# Patient Record
Sex: Female | Born: 1990
Health system: Southern US, Community
[De-identification: ages and names within clinical notes are randomized; demographics above are authoritative.]

## PROBLEM LIST (undated history)

## (undated) DIAGNOSIS — E669 Obesity, unspecified: Secondary | ICD-10-CM

## (undated) DIAGNOSIS — F909 Attention-deficit hyperactivity disorder, unspecified type: Secondary | ICD-10-CM

## (undated) DIAGNOSIS — N938 Other specified abnormal uterine and vaginal bleeding: Secondary | ICD-10-CM

## (undated) DIAGNOSIS — F4310 Post-traumatic stress disorder, unspecified: Secondary | ICD-10-CM

## (undated) DIAGNOSIS — I10 Essential (primary) hypertension: Secondary | ICD-10-CM

## (undated) HISTORY — PX: TONSILLECTOMY AND ADENOIDECTOMY: SUR1326

## (undated) HISTORY — DX: Essential (primary) hypertension: I10

## (undated) HISTORY — DX: Obesity, unspecified: E66.9

## (undated) HISTORY — DX: Other specified abnormal uterine and vaginal bleeding: N93.8

---
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
## 2013-04-26 ENCOUNTER — Telehealth: Payer: Self-pay | Admitting: General Practice

## 2013-04-26 NOTE — Telephone Encounter (Signed)
PT WANTED APPT TO ESTABLISH CARE APPT MADE

## 2013-04-27 ENCOUNTER — Encounter: Payer: Self-pay | Admitting: Family Medicine

## 2013-04-27 ENCOUNTER — Encounter (INDEPENDENT_AMBULATORY_CARE_PROVIDER_SITE_OTHER): Payer: Self-pay

## 2013-04-27 ENCOUNTER — Ambulatory Visit (INDEPENDENT_AMBULATORY_CARE_PROVIDER_SITE_OTHER): Payer: BC Managed Care – PPO | Admitting: Family Medicine

## 2013-04-27 VITALS — BP 147/94 | HR 94 | Temp 97.9°F | Ht 66.5 in | Wt 350.8 lb

## 2013-04-27 DIAGNOSIS — R059 Cough, unspecified: Secondary | ICD-10-CM

## 2013-04-27 DIAGNOSIS — R05 Cough: Secondary | ICD-10-CM

## 2013-04-27 DIAGNOSIS — J029 Acute pharyngitis, unspecified: Secondary | ICD-10-CM

## 2013-04-27 DIAGNOSIS — J209 Acute bronchitis, unspecified: Secondary | ICD-10-CM

## 2013-04-27 LAB — POCT INFLUENZA A/B
Influenza A, POC: NEGATIVE
Influenza B, POC: NEGATIVE

## 2013-04-27 LAB — POCT RAPID STREP A (OFFICE): Rapid Strep A Screen: NEGATIVE

## 2013-04-27 MED ORDER — BENZONATATE 100 MG PO CAPS: 100.0000 mg | ORAL_CAPSULE | Freq: Three times a day (TID) | ORAL | Status: DC | PRN

## 2013-04-27 MED ORDER — AZITHROMYCIN 250 MG PO TABS: ORAL_TABLET | ORAL | Status: DC

## 2013-04-27 NOTE — Patient Instructions (Signed)

## 2013-04-27 NOTE — Progress Notes (Signed)
   Subjective:    Patient ID: Tara Bush, female    DOB: 1990-07-12, 23 y.o.   MRN: [id number]  HPI This 23 y.o. female presents for evaluation of cough, congestion, wheezing, and  malaise.   Review of Systems No chest pain, SOB, HA, dizziness, vision change, N/V, diarrhea, constipation, dysuria, urinary urgency or frequency, myalgias, arthralgias or rash.     Objective:   Physical Exam  Vital signs noted  Well developed well nourished obese female.  HEENT - Head atraumatic Normocephalic                Eyes - PERRLA, Conjuctiva - clear Sclera- Clear EOMI                Ears - EAC's Wnl TM's Wnl Gross Hearing WNL                Throat - oropharanx wnl Respiratory - Lungs diminished due to body habitus Cardiac - RRR S1 and S2 without murmur GI - Abdomen soft Nontender and bowel sounds active x 4 Extremities - No edema. Neuro - Grossly intact.      Assessment & Plan:  Cough - Plan: POCT Influenza A/B, POCT rapid strep A, azithromycin (ZITHROMAX) 250 MG tablet, benzonatate (TESSALON PERLES) 100 MG capsule  Sore throat - Plan: POCT Influenza A/B, POCT rapid strep A, azithromycin (ZITHROMAX) 250 MG tablet  Acute bronchitis - Plan: azithromycin (ZITHROMAX) 250 MG tablet, benzonatate (TESSALON PERLES) 100 MG capsule Push po fluids, rest, tylenol and motrin otc prn as directed for fever, arthralgias, and myalgias.  Follow up prn if sx's continue or persist.  Deatra CanterWilliam J Jon Lall FNP

## 2013-05-04 ENCOUNTER — Encounter: Payer: Self-pay | Admitting: *Deleted

## 2013-05-19 ENCOUNTER — Encounter: Payer: Self-pay | Admitting: General Practice

## 2013-05-19 ENCOUNTER — Ambulatory Visit (INDEPENDENT_AMBULATORY_CARE_PROVIDER_SITE_OTHER): Payer: BC Managed Care – PPO | Admitting: General Practice

## 2013-05-19 VITALS — BP 144/77 | HR 84 | Temp 98.6°F | Ht 66.5 in | Wt 353.4 lb

## 2013-05-19 DIAGNOSIS — R635 Abnormal weight gain: Secondary | ICD-10-CM

## 2013-05-19 DIAGNOSIS — Z7689 Persons encountering health services in other specified circumstances: Secondary | ICD-10-CM

## 2013-05-19 NOTE — Progress Notes (Signed)
   Subjective:    Patient ID: Tara Bush, female    DOB: Jun 25, 1990, 23 y.o.   MRN: [id number]  HPI Patient presents today to establish care. She denies having a primary provider. Had labs drawn in past few weeks when having gyn exam. She denies any chronic health issues. Taking deporvera injections for pregnancy prevention. Currently participating in weight watchers and exercising regularly.     Review of Systems  Constitutional: Negative for fever and chills.  Respiratory: Negative for chest tightness and shortness of breath.   Cardiovascular: Negative for chest pain and palpitations.  Gastrointestinal: Negative for nausea, vomiting, abdominal pain, diarrhea, constipation and blood in stool.  Neurological: Negative for dizziness, weakness and headaches.       Objective:   Physical Exam  Constitutional: She is oriented to person, place, and time.  Obese   HENT:  Head: Normocephalic and atraumatic.  Right Ear: External ear normal.  Left Ear: External ear normal.  Mouth/Throat: Oropharynx is clear and moist.  Eyes: EOM are normal. Pupils are equal, round, and reactive to light.  Neck: Normal range of motion. Neck supple.  Pulmonary/Chest: Effort normal and breath sounds normal.  Abdominal: Soft. Bowel sounds are normal. She exhibits no distension. There is no tenderness.  Neurological: She is alert and oriented to person, place, and time.  Skin: Skin is warm and dry.  Psychiatric: She has a normal mood and affect.          Assessment & Plan:  1. Encounter to establish care   2. Morbid obesity -discussed importance of healthy eating and regular exercise -Patient to have recent labs faxed from GYN office -RTO prn -Patient verbalized understanding Coralie KeensMae E. Rachael Zapanta, FNP-C

## 2013-05-21 ENCOUNTER — Ambulatory Visit: Payer: BC Managed Care – PPO | Admitting: General Practice

## 2013-05-21 ENCOUNTER — Ambulatory Visit: Payer: Self-pay | Admitting: General Practice

## 2013-05-26 ENCOUNTER — Encounter: Payer: Self-pay | Admitting: *Deleted

## 2013-08-30 ENCOUNTER — Ambulatory Visit: Payer: BC Managed Care – PPO | Admitting: Family Medicine

## 2014-03-09 ENCOUNTER — Encounter: Payer: Self-pay | Admitting: Family Medicine

## 2014-03-09 ENCOUNTER — Ambulatory Visit (INDEPENDENT_AMBULATORY_CARE_PROVIDER_SITE_OTHER): Payer: BLUE CROSS/BLUE SHIELD | Admitting: Family Medicine

## 2014-03-09 VITALS — BP 152/102 | HR 131 | Temp 98.5°F | Ht 66.5 in | Wt 365.0 lb

## 2014-03-09 DIAGNOSIS — I10 Essential (primary) hypertension: Secondary | ICD-10-CM

## 2014-03-09 DIAGNOSIS — N938 Other specified abnormal uterine and vaginal bleeding: Secondary | ICD-10-CM

## 2014-03-09 MED ORDER — LISINOPRIL 5 MG PO TABS: 5.0000 mg | ORAL_TABLET | Freq: Every day | ORAL | Status: DC

## 2014-03-09 NOTE — Progress Notes (Signed)
Subjective:    Patient ID: Tara Bush, female    DOB: 1990-10-07, 24 y.o.   MRN: [id number]  HPI 24 year old female comes in today to discuss obesity and concern of elevated blood pressure. She has not monitored her blood pressure but feels that it is elevated. She also complains of dysfunctional uterine bleeding for about 4 months. She has seen a gynecologist in the past for this but it has not resolved and she hasn't been able to work a follow up into her schedule.  We spent a lot of time talking about her weight and how it might impact her life going forward. She has been on several diets and had some success only to fall back into her normal eating patterns and regain of lost weight. She does not really have money to spend on somewhat more popular diet plans like Weight Watchers but given her past experience I think she should be able to realize some success in this endeavor  She has had vaginal bleeding for several months now. She has been worked up by gynecologist in [place] but I do not have results. They seem to relate everything back to her weight. She questions about possibility of using oral contraceptives to help regulate bleeding  Patient Active Problem List   Diagnosis Date Noted  . Morbid obesity 05/20/2013   Outpatient Encounter Prescriptions as of 03/09/2014  Medication Sig  . medroxyPROGESTERone (PROVERA) 10 MG tablet   . OVER THE COUNTER MEDICATION Take 1 capsule by mouth 2 (two) times daily as needed. For cough. Cough and Cold gel caps. Unknown active ingredients or strengths   . PRESCRIPTION MEDICATION Take 1 tablet by mouth daily. Oral birth control unknown name and strength.   . Vitamin D, Ergocalciferol, (DRISDOL) 50000 UNITS CAPS capsule        Review of Systems  Constitutional: Positive for unexpected weight change.  HENT: Negative.   Eyes: Negative.   Respiratory: Negative.   Cardiovascular: Negative.   Gastrointestinal: Negative.   Endocrine:  Negative.   Genitourinary: Positive for vaginal bleeding.  Hematological: Negative.   Psychiatric/Behavioral: Negative.        Objective:   Physical Exam  Constitutional: She is oriented to person, place, and time. She appears well-developed and well-nourished.  Morbid obesity as noted before  Eyes: Conjunctivae and EOM are normal.  Neck: Normal range of motion. Neck supple.  Cardiovascular: Normal rate and regular rhythm.   Pulmonary/Chest: Effort normal and breath sounds normal.  Abdominal: Soft. Bowel sounds are normal.  Musculoskeletal: Normal range of motion.  Neurological: She is alert and oriented to person, place, and time. She has normal reflexes.  Skin: Skin is warm and dry.  Psychiatric: She has a normal mood and affect. Her behavior is normal. Thought content normal.   BP 152/102 mmHg  Pulse 131  Temp(Src) 98.5 F (36.9 C) (Oral)  Ht 5' 6.5" (1.689 m)  Wt 365 lb (165.563 kg)  BMI 58.04 kg/m2        Assessment & Plan:  1. Dysfunctional uterine bleeding Consider use of oral contraceptives once blood pressure is better controlled. Would also like to obtain records from gynecologist to see what workup has been thus far  2. Obesity, morbid Commit to weight loss cut back on carbohydrates and exercise. She has insight into her problem and I think she could be successful if she really sets her mind to it  3. Essential hypertension Begin lisinopril 5 mg and recheck her in  one month. She may be able to come off medicines if she 6   Frederica KusterStephen M Miller MDseeds with weight loss

## 2014-04-11 ENCOUNTER — Ambulatory Visit: Payer: BLUE CROSS/BLUE SHIELD | Admitting: Family Medicine

## 2014-05-25 ENCOUNTER — Ambulatory Visit: Payer: BLUE CROSS/BLUE SHIELD | Admitting: Family Medicine

## 2014-05-31 ENCOUNTER — Other Ambulatory Visit: Payer: BLUE CROSS/BLUE SHIELD | Admitting: Women's Health

## 2014-06-07 ENCOUNTER — Encounter: Payer: Self-pay | Admitting: Advanced Practice Midwife

## 2014-06-07 ENCOUNTER — Other Ambulatory Visit (HOSPITAL_COMMUNITY)
Admission: RE | Admit: 2014-06-07 | Discharge: 2014-06-07 | Disposition: A | Discharge status: Home / Self Care | Payer: BLUE CROSS/BLUE SHIELD | Source: Ambulatory Visit | Attending: Obstetrics and Gynecology | Admitting: Obstetrics and Gynecology

## 2014-06-07 ENCOUNTER — Ambulatory Visit (INDEPENDENT_AMBULATORY_CARE_PROVIDER_SITE_OTHER): Payer: BLUE CROSS/BLUE SHIELD | Admitting: Advanced Practice Midwife

## 2014-06-07 VITALS — BP 150/86 | Ht 67.0 in | Wt 369.5 lb

## 2014-06-07 DIAGNOSIS — Z3200 Encounter for pregnancy test, result unknown: Secondary | ICD-10-CM

## 2014-06-07 DIAGNOSIS — I10 Essential (primary) hypertension: Secondary | ICD-10-CM | POA: Insufficient documentation

## 2014-06-07 DIAGNOSIS — N938 Other specified abnormal uterine and vaginal bleeding: Secondary | ICD-10-CM | POA: Insufficient documentation

## 2014-06-07 DIAGNOSIS — Z3202 Encounter for pregnancy test, result negative: Secondary | ICD-10-CM | POA: Diagnosis not present

## 2014-06-07 DIAGNOSIS — Z01419 Encounter for gynecological examination (general) (routine) without abnormal findings: Secondary | ICD-10-CM | POA: Insufficient documentation

## 2014-06-07 LAB — POCT URINE PREGNANCY: PREG TEST UR: NEGATIVE

## 2014-06-07 MED ORDER — NORGESTIMATE-ETH ESTRADIOL 0.25-35 MG-MCG PO TABS: 1.0000 | ORAL_TABLET | Freq: Every day | ORAL | Status: DC

## 2014-06-07 NOTE — Progress Notes (Signed)
Tara Bush 24 y.o.  Filed Vitals:   06/07/14 1507  BP: 150/86     Past Medical History: Past Medical History  Diagnosis Date  . Post traumatic stress disorder (PTSD)   . Obesity   . Dysfunctional uterine bleeding   . Hypertension     started lisinopril 1/16    Past Surgical History: Past Surgical History  Procedure Laterality Date  . Tonsillectomy and adenoidectomy      Family History: Family History  Problem Relation Age of Onset  . Diabetes Mother   . Multiple sclerosis Mother   . Stroke Mother   . Hypertension Mother   . Hypertension Father   . Cancer Maternal Grandmother     ovarian/ uterine     Social History: History  Substance Use Topics  . Smoking status: Never Smoker   . Smokeless tobacco: Never Used  . Alcohol Use: No    Allergies:  Allergies  Allergen Reactions  . Nutritional Supplements Anaphylaxis      Current outpatient prescriptions:  .  norgestimate-ethinyl estradiol (ORTHO-CYCLEN,SPRINTEC,PREVIFEM) 0.25-35 MG-MCG tablet, Take 1 tablet by mouth daily., Disp: 1 Package, Rfl: 11  History of Present Illness: Here for her first pap.  She is married and wants birth contril (using condoms).  She bled for several months last year, had US and bloodwork that didn't reveal any abnormaliities.  Morbidly obese, could contribute.  Often skips periods as well.  She has been bleeding off and on, light to heavy, for 6 months.  She wants COC's.  States she has started [employer] wat[employer]. Was dx wit hHTN and put on lisionoprl in January.  Took it for one month only (thought that was all she would have to take it).    Review of Systems   Patient denies any headaches, blurred vision, shortness of breath, chest pain, abdominal pain, problems with bowel movements, urination, or intercourse.   Physical Exam: General:  Well developed, well nourished, no acute distress Skin:  Warm and dry Neck:  Midline trachea, normal thyroid Lungs; Clear to  auscultation bilaterally Breast:  No dominant palpable mass, retraction, or nipple discharge Cardiovascular: Regular rate and rhythm Abdomen:  Soft, non tender, no hepatosplenomegaly Pelvic:  External genitalia is normal in appearance.  The vagina is normal in appearance. Scant amount of dark red blood.  The cervix is bulbous and non friable.   Uterus is impossible to feel.  No adnexal masses or tenderness noted.  Extremities:  No swelling or varicosities noted Psych:  No mood changes.     Impression: Pap and physical DUB ? R/t morbid obesity HTN, untreated     Plan: Find out what kind of gardisl she has taken, get last shot next visit.   See PCP and get back on lisinopril  Continue weight watchers  Start Sprintec F/U 3 months

## 2014-06-09 LAB — CYTOLOGY - PAP

## 2014-09-06 ENCOUNTER — Ambulatory Visit: Payer: BLUE CROSS/BLUE SHIELD | Admitting: Obstetrics & Gynecology

## 2014-09-06 ENCOUNTER — Encounter: Payer: Self-pay | Admitting: Obstetrics & Gynecology

## 2014-11-28 ENCOUNTER — Telehealth: Payer: Self-pay | Admitting: Family Medicine

## 2014-12-14 ENCOUNTER — Encounter: Payer: Self-pay | Admitting: Pediatrics

## 2014-12-14 ENCOUNTER — Ambulatory Visit (INDEPENDENT_AMBULATORY_CARE_PROVIDER_SITE_OTHER): Payer: BLUE CROSS/BLUE SHIELD | Admitting: Pediatrics

## 2014-12-14 VITALS — BP 149/87 | HR 89 | Temp 98.6°F | Ht 67.0 in | Wt 389.8 lb

## 2014-12-14 DIAGNOSIS — R55 Syncope and collapse: Secondary | ICD-10-CM

## 2014-12-14 DIAGNOSIS — I10 Essential (primary) hypertension: Secondary | ICD-10-CM | POA: Diagnosis not present

## 2014-12-14 MED ORDER — HYDROCHLOROTHIAZIDE 12.5 MG PO TABS: 12.5000 mg | ORAL_TABLET | Freq: Every day | ORAL | Status: DC

## 2014-12-14 NOTE — Progress Notes (Signed)
Subjective:    Patient ID: Tara Bush, female    DOB: 04-30-1990, 24 y.o.   MRN: [id number]  CC: weight loss strategies  HPI: Tara Bush is a 24 y.o. female presenting on 12/14/2014 for Discuss Weight Loss  Wants to talk about weight loss. BP has been elevated in the past.  Working full time, in school full time Not eating much salt Doing well with breakfast and lunch sometimes, [place], salad, grapes. 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 Felt better as soon as she sat down. No CP or SOB. Did not pass out. Had a similar episode a year ago on a very hot day when she was overdressed. No fevers.  Relevant past medical, surgical, family and social history reviewed and updated as indicated. Interim medical history since our last visit reviewed. Allergies and medications reviewed and updated.   ROS: Per HPI unless specifically indicated above  Past Medical History Patient Active Problem List   Diagnosis Date Noted  . DUB (dysfunctional uterine bleeding) 06/07/2014  . Hypertension   . Morbid obesity (HCC) 05/20/2013    Current Outpatient Prescriptions  Medication Sig Dispense Refill  . hydrochlorothiazide (HYDRODIURIL) 12.5 MG tablet Take 1 tablet (12.5 mg total) by mouth daily. 30 tablet 1   No current facility-administered medications for this visit.       Objective:    BP 149/87 mmHg  Pulse 89   Temp(Src) 98.6 F (37 C) (Oral)  Ht 5\' 7"  (1.702 m)  Wt 389 lb 12.8 oz (176.812 kg)  BMI 61.04 kg/m2  Wt Readings from Last 3 Encounters:  12/14/14 389 lb 12.8 oz (176.812 kg)  06/07/14 369 lb 8 oz (167.604 kg)  03/09/14 365 lb (165.563 kg)    en: NAD, alert, cooperative with exam, NCAT EYES: EOMI, no scleral injection or icterus ENT:  OP without erythema LYMPH: no cervical LAD CV: NRRR, normal S1/S2, no murmur, WWP Resp: CTABL, no wheezes, normal WOB Abd: +BS, soft, NTND. Ext: No edema, warm Neuro: Alert and oriented, strength equal b/l UE and LE     Assessment & Plan:   Lashondra was seen today for discuss weight loss strategies. Pt feels addicted to food. Discussed multiple goals, set with pt, that she can work on in next 4 weeks, see pt instructions for specifics. Also recommended she see a counselor for continued dependence on food, pt thinks this will be helpful. Blood pressure has been high for most recent visits, started on lisinopril in the past says it made her feel bad. Will check labs today, start HCTZ 12.5mg . Likely with episode of vasovagal syncope last weekend after getting overheated. Did not pass out. Felt symptoms coming on, improved as soon as she sat down. Will let me know if has another episode.   Diagnoses and all orders for this visit:  Essential hypertension -     hydrochlorothiazide (HYDRODIURIL) 12.5 MG  tablet; Take 1 tablet (12.5 mg total) by mouth daily. -     BMP8+EGFR -     Lipid panel  Morbid obesity, unspecified obesity type (HCC) -     Lipid panel  Vasovagal syncope  I spent 30 minutes with the patient with over 50% of the encounter time dedicated to counseling on the above problems.    Follow up plan: Return in about 4 weeks (around 01/11/2015).  Assunta Found, MD [facility] 12/14/2014, 10:39 AM

## 2014-12-14 NOTE — Patient Instructions (Addendum)
Packing lunch night before No fast food Go for 15-30 minute walk at lunch Zumba in the morning before work 30-60 minutes walk on weekend days Instead of extra meal, go for a walk, go do something fun Planning meals and making an event out of them  24 hour a day CRISIS NUMBER: [phone]   List of local counseling services:  [facility]- Therapist [address] [place] ,[place] [id number] [phone] Children limited to anxiety and depression- NO ADD/ADHD Does not accept Medicaid  [facility] [address] [place], [place] [phone] Does see children Does accept medicaid Will assess for Autism but not treat  [facility] [address] [place] [phone] Does see children  Does accept Medicaid Medication management- substance abuse- bipolar- grief- family-marriage- OCD- Anxiety- PTSD  [facility] [address] [place]  [phone] Does see children Does accept medicaid They do perform psychological testing  [facility] [address] [place] Schedule through [employer] [phone] Patient must call and make own appointment Does se children Does accept Medicaid  [facility] [address] [place], [place] [phone] Sees Children 7-10 accompanied by an adult, 11 and up by themselves Does accept Medicaid Will see patients with- substance abuse-ADHD-ADD-Bipolar-Domestic violence-Marriage counseling- Family Counseling and sexual abuse  [facility]- Psychologist and Psychiatrist [address] [place] [phone] Does see children Does accept [facility]  [facility] [address] [place] [phone]  Dr. Dub MikesLugo-  Psychiatrist [address] [place], [place] [phone] Specializes in ADHD and addictions  [facility] [address] [place] [phone] Does Child psychological testing  [facility] [address] [place] [phone] Does Accept Medicaid Evaluates for Autism  [facility] [address] [place] [phone] Does Not accept Medicaid  Dr. Estelle GrumblesAkinlayo [address] [place] [phone] Does not Take Medicaid  [facility] [address] [place], [place] [address] [phone] Takes Medicaid WIll see children as young as 3

## 2014-12-15 LAB — LIPID PANEL
CHOL/HDL RATIO: 5.2 ratio — AB (ref 0.0–4.4)
Cholesterol, Total: 162 mg/dL (ref 100–199)
HDL: 31 mg/dL — AB (ref >39)
LDL Calculated: 106 mg/dL — ABNORMAL HIGH (ref 0–99)
TRIGLYCERIDES: 124 mg/dL (ref 0–149)
VLDL CHOLESTEROL CAL: 25 mg/dL (ref 5–40)

## 2014-12-15 LAB — BMP8+EGFR
BUN/Creatinine Ratio: 18 (ref 8–20)
BUN: 12 mg/dL (ref 6–20)
CALCIUM: 9.3 mg/dL (ref 8.7–10.2)
CO2: 24 mmol/L (ref 18–29)
CREATININE: 0.68 mg/dL (ref 0.57–1.00)
Chloride: 100 mmol/L (ref 97–106)
GFR, EST AFRICAN AMERICAN: 144 mL/min/{1.73_m2} (ref >59)
GFR, EST NON AFRICAN AMERICAN: 125 mL/min/{1.73_m2} (ref >59)
Glucose: 92 mg/dL (ref 65–99)
Potassium: 4.8 mmol/L (ref 3.5–5.2)
Sodium: 138 mmol/L (ref 136–144)

## 2014-12-16 ENCOUNTER — Telehealth: Payer: Self-pay | Admitting: Family Medicine

## 2014-12-16 NOTE — Telephone Encounter (Signed)
Patient aware of results.

## 2014-12-16 NOTE — Progress Notes (Signed)
Patient aware.

## 2014-12-27 ENCOUNTER — Telehealth: Payer: Self-pay | Admitting: Pediatrics

## 2014-12-27 DIAGNOSIS — E282 Polycystic ovarian syndrome: Secondary | ICD-10-CM

## 2015-01-03 MED ORDER — NORGESTIMATE-ETH ESTRADIOL 0.25-35 MG-MCG PO TABS: 1.0000 | ORAL_TABLET | Freq: Every day | ORAL | Status: DC

## 2015-01-03 NOTE — Telephone Encounter (Signed)
Called pt, periods are usually irregular, has them for a couple of months, then doesn't have them for a couple of months. Usually would have one day with lots of bleeding, then not as much bleeding the rest of the time. Now on 2.5 weeks of bleeding. Has had up to 7 months of needing to change pad multiple times a day. Is not sure how long a normal period would last for her because all so varied. Migraine once a year, light sensitivity, no auras. No history of clotting problems in family. Does have hair growth on face, with irregular periods likely has PCOS. Next visit needs TSH check in addition to BMP for new start HCTZ. Will start OCP.

## 2015-01-03 NOTE — Telephone Encounter (Signed)
Patient states that since starting the HCTZ she is experiencing really bad menstrual cycles. Please advise

## 2015-01-11 ENCOUNTER — Ambulatory Visit: Payer: BLUE CROSS/BLUE SHIELD | Admitting: Pediatrics

## 2015-01-18 ENCOUNTER — Ambulatory Visit: Payer: BLUE CROSS/BLUE SHIELD | Admitting: Pediatrics

## 2015-01-19 ENCOUNTER — Encounter: Payer: Self-pay | Admitting: Family Medicine

## 2015-01-29 ENCOUNTER — Emergency Department (HOSPITAL_COMMUNITY): Payer: No Typology Code available for payment source

## 2015-01-29 ENCOUNTER — Emergency Department (HOSPITAL_COMMUNITY)
Admission: EM | Admit: 2015-01-29 | Discharge: 2015-01-29 | Disposition: A | Discharge status: Home / Self Care | Payer: No Typology Code available for payment source | Attending: Emergency Medicine | Admitting: Emergency Medicine

## 2015-01-29 ENCOUNTER — Encounter (HOSPITAL_COMMUNITY): Payer: Self-pay | Admitting: Emergency Medicine

## 2015-01-29 DIAGNOSIS — Z8659 Personal history of other mental and behavioral disorders: Secondary | ICD-10-CM | POA: Insufficient documentation

## 2015-01-29 DIAGNOSIS — S3992XA Unspecified injury of lower back, initial encounter: Secondary | ICD-10-CM | POA: Diagnosis not present

## 2015-01-29 DIAGNOSIS — E669 Obesity, unspecified: Secondary | ICD-10-CM | POA: Insufficient documentation

## 2015-01-29 DIAGNOSIS — Y998 Other external cause status: Secondary | ICD-10-CM | POA: Diagnosis not present

## 2015-01-29 DIAGNOSIS — Y92410 Unspecified street and highway as the place of occurrence of the external cause: Secondary | ICD-10-CM | POA: Insufficient documentation

## 2015-01-29 DIAGNOSIS — Y9389 Activity, other specified: Secondary | ICD-10-CM | POA: Diagnosis not present

## 2015-01-29 DIAGNOSIS — Z8742 Personal history of other diseases of the female genital tract: Secondary | ICD-10-CM | POA: Insufficient documentation

## 2015-01-29 DIAGNOSIS — Z793 Long term (current) use of hormonal contraceptives: Secondary | ICD-10-CM | POA: Insufficient documentation

## 2015-01-29 DIAGNOSIS — S299XXA Unspecified injury of thorax, initial encounter: Secondary | ICD-10-CM | POA: Diagnosis present

## 2015-01-29 DIAGNOSIS — S298XXA Other specified injuries of thorax, initial encounter: Secondary | ICD-10-CM

## 2015-01-29 DIAGNOSIS — I10 Essential (primary) hypertension: Secondary | ICD-10-CM | POA: Insufficient documentation

## 2015-01-29 MED ORDER — IBUPROFEN 800 MG PO TABS
800.0000 mg | ORAL_TABLET | Freq: Once | ORAL | Status: AC
  Administered 2015-01-29: 800 mg via ORAL
  Filled 2015-01-29: qty 1
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
## 2015-03-30 ENCOUNTER — Encounter: Payer: Self-pay | Admitting: Pediatrics

## 2015-03-30 ENCOUNTER — Ambulatory Visit (INDEPENDENT_AMBULATORY_CARE_PROVIDER_SITE_OTHER): Payer: BLUE CROSS/BLUE SHIELD | Admitting: Pediatrics

## 2015-03-30 VITALS — BP 179/97 | HR 83 | Temp 97.4°F | Ht 67.0 in | Wt 384.2 lb

## 2015-03-30 DIAGNOSIS — E282 Polycystic ovarian syndrome: Secondary | ICD-10-CM | POA: Diagnosis not present

## 2015-03-30 DIAGNOSIS — Z6841 Body Mass Index (BMI) 40.0 and over, adult: Secondary | ICD-10-CM | POA: Insufficient documentation

## 2015-03-30 DIAGNOSIS — I10 Essential (primary) hypertension: Secondary | ICD-10-CM | POA: Diagnosis not present

## 2015-03-30 LAB — POCT URINE PREGNANCY: PREG TEST UR: NEGATIVE

## 2015-03-30 MED ORDER — HYDROCHLOROTHIAZIDE 25 MG PO TABS: 25.0000 mg | ORAL_TABLET | Freq: Every day | ORAL | Status: DC

## 2015-03-30 MED ORDER — NORGESTIMATE-ETH ESTRADIOL 0.25-35 MG-MCG PO TABS: 1.0000 | ORAL_TABLET | Freq: Every day | ORAL | Status: DC

## 2015-03-30 NOTE — Progress Notes (Signed)
Subjective:    Patient ID: Tara Bush, female    DOB: November 29, 1990, 25 y.o.   MRN: [id number]  CC: Follow-up and Fatigue   HPI: Tara Bush is a 25 y.o. female presenting for Follow-up and Fatigue  Elevated BMI:  Cut out sodas, drinking water Has a fitbit Calorie count on best days 1200-1500 kcal/day Been going to the gym, working 10-7pm  HTN: No headaches other than on stressful days at work. Getting glasses for vision decreasing Was takin gmedicine daily until ran out two days ago  Fatigue: Ongoing problem Working long hours, trying to make it to the gym at night Sleeping ok  Depression screen [facility] 2/9 03/30/2015 12/14/2014  Decreased Interest 0 0  Down, Depressed, Hopeless 0 0  PHQ - 2 Score 0 0     Relevant past medical, surgical, family and social history reviewed and updated as indicated. Interim medical history since our last visit reviewed. Allergies and medications reviewed and updated.    ROS: Per HPI unless specifically indicated above  History  Smoking status  . Never Smoker   Smokeless tobacco  . Never Used    Past Medical History Patient Active Problem List   Diagnosis Date Noted  . BMI 60.0-69.9, adult ([place]) 03/30/2015  . PCOS (polycystic ovarian syndrome) 03/30/2015  . DUB (dysfunctional uterine bleeding) 06/07/2014  . Hypertension   . Morbid obesity ([place]) 05/20/2013    Current Outpatient Prescriptions  Medication Sig Dispense Refill  . aspirin-acetaminophen-caffeine (EXCEDRIN MIGRAINE) 250-250-65 MG tablet Take 1 tablet by mouth every 6 (six) hours as needed for headache.    . hydrochlorothiazide (HYDRODIURIL) 25 MG tablet Take 1 tablet (25 mg total) by mouth daily. 90 tablet 1  . norgestimate-ethinyl estradiol (ORTHO-CYCLEN,SPRINTEC,PREVIFEM) 0.25-35 MG-MCG tablet Take 1 tablet by mouth daily. 1 Package 11   No current facility-administered medications for this visit.       Objective:    BP 179/97 mmHg  Pulse 83   Temp(Src) 97.4 F (36.3 C) (Oral)  Ht '5\' 7"'$  (1.702 m)  Wt 384 lb 3.2 oz (174.272 kg)  BMI 60.16 kg/m2  Wt Readings from Last 3 Encounters:  03/30/15 384 lb 3.2 oz (174.272 kg)  01/29/15 375 lb (170.099 kg)  12/14/14 389 lb 12.8 oz (176.812 kg)     Gen: NAD, alert, cooperative with exam, NCAT EYES: EOMI, no scleral injection or icterus ENT:  TMs pearly gray b/l, OP without erythema LYMPH: no cervical LAD CV: NRRR, normal S1/S2, no murmur, distal pulses 2+ b/l Resp: CTABL, no wheezes, normal WOB Ext: No edema, warm Neuro: Alert and oriented     Assessment & Plan:    Tara Bush was seen today for follow-up and fatigue.  Diagnoses and all orders for this visit:  PCOS (polycystic ovarian syndrome) -     norgestimate-ethinyl estradiol (ORTHO-CYCLEN,SPRINTEC,PREVIFEM) 0.25-35 MG-MCG tablet; Take 1 tablet by mouth daily. -     POCT urine pregnancy  Essential hypertension Poorly controlled today, better numbers at home but still elevated. Increase HCTZ. Keep checking at home. -     hydrochlorothiazide (HYDRODIURIL) 25 MG tablet; Take 1 tablet (25 mg total) by mouth daily. -     BMP8+EGFR  BMI 60.0-69.9, adult ([facility]) Discussed lifestyle changes, will have her see Tammy.  -     TSH    Follow up plan: Return for F/u Tammy weight loss when available, Dr. Evette Doffing in 6 weeks.  Assunta Found, MD [facility] 03/30/2015, 9:28 AM

## 2015-03-31 LAB — BMP8+EGFR
BUN / CREAT RATIO: 25 — AB (ref 8–20)
BUN: 15 mg/dL (ref 6–20)
CO2: 21 mmol/L (ref 18–29)
CREATININE: 0.6 mg/dL (ref 0.57–1.00)
Calcium: 9.4 mg/dL (ref 8.7–10.2)
Chloride: 103 mmol/L (ref 96–106)
GFR calc Af Amer: 150 mL/min/{1.73_m2} (ref >59)
GFR, EST NON AFRICAN AMERICAN: 130 mL/min/{1.73_m2} (ref >59)
GLUCOSE: 131 mg/dL — AB (ref 65–99)
Potassium: 4.6 mmol/L (ref 3.5–5.2)
SODIUM: 140 mmol/L (ref 134–144)

## 2015-03-31 LAB — TSH: TSH: 2.34 u[IU]/mL (ref 0.450–4.500)

## 2015-04-07 ENCOUNTER — Ambulatory Visit (INDEPENDENT_AMBULATORY_CARE_PROVIDER_SITE_OTHER): Payer: BLUE CROSS/BLUE SHIELD | Admitting: Pharmacist

## 2015-04-07 ENCOUNTER — Ambulatory Visit (INDEPENDENT_AMBULATORY_CARE_PROVIDER_SITE_OTHER): Payer: BLUE CROSS/BLUE SHIELD

## 2015-04-07 ENCOUNTER — Encounter: Payer: Self-pay | Admitting: Pharmacist

## 2015-04-07 VITALS — BP 140/86 | HR 82 | Ht 67.0 in | Wt 379.5 lb

## 2015-04-07 DIAGNOSIS — E282 Polycystic ovarian syndrome: Secondary | ICD-10-CM | POA: Diagnosis not present

## 2015-04-07 DIAGNOSIS — Z23 Encounter for immunization: Secondary | ICD-10-CM
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
## 2015-04-29 ENCOUNTER — Other Ambulatory Visit: Payer: Self-pay | Admitting: Pediatrics

## 2015-05-11 ENCOUNTER — Ambulatory Visit: Payer: BLUE CROSS/BLUE SHIELD | Admitting: Pediatrics

## 2015-05-12 ENCOUNTER — Encounter: Payer: Self-pay | Admitting: Pediatrics

## 2015-05-17 ENCOUNTER — Ambulatory Visit: Payer: Self-pay | Admitting: Pharmacist

## 2015-10-04 ENCOUNTER — Ambulatory Visit (INDEPENDENT_AMBULATORY_CARE_PROVIDER_SITE_OTHER): Payer: BLUE CROSS/BLUE SHIELD | Admitting: Women's Health

## 2015-10-04 ENCOUNTER — Encounter: Payer: Self-pay | Admitting: Women's Health

## 2015-10-04 VITALS — BP 140/84 | HR 76 | Ht 67.0 in | Wt 364.6 lb

## 2015-10-04 DIAGNOSIS — Z6841 Body Mass Index (BMI) 40.0 and over, adult: Secondary | ICD-10-CM

## 2015-10-04 DIAGNOSIS — E282 Polycystic ovarian syndrome: Secondary | ICD-10-CM

## 2015-10-04 DIAGNOSIS — N907 Vulvar cyst: Secondary | ICD-10-CM | POA: Diagnosis not present

## 2015-10-04 DIAGNOSIS — Z113 Encounter for screening for infections with a predominantly sexual mode of transmission: Secondary | ICD-10-CM

## 2015-10-04 DIAGNOSIS — Z3009 Encounter for other general counseling and advice on contraception: Secondary | ICD-10-CM

## 2015-10-04 DIAGNOSIS — Z3202 Encounter for pregnancy test, result negative: Secondary | ICD-10-CM

## 2015-10-04 LAB — POCT URINE PREGNANCY: Preg Test, Ur: NEGATIVE

## 2015-10-04 MED ORDER — MISOPROSTOL 200 MCG PO TABS: 400.0000 ug | ORAL_TABLET | Freq: Once | ORAL | 0 refills | Status: DC

## 2015-10-04 NOTE — Patient Instructions (Addendum)
NO SEX UNTIL AFTER YOU GET YOUR BIRTH CONTROL   Levonorgestrel intrauterine device (IUD) What is this medicine? LEVONORGESTREL IUD (LEE voe nor jes trel) is a contraceptive (birth control) device. The device is placed inside the uterus by a healthcare professional. It is used to prevent pregnancy and can also be used to treat heavy bleeding that occurs during your period. Depending on the device, it can be used for 3 to 5 years. This medicine may be used for other purposes; ask your health care provider or pharmacist if you have questions. What should I tell my health care provider before I take this medicine? They need to know if you have any of these conditions: -abnormal Pap smear -cancer of the breast, uterus, or cervix -diabetes -endometritis -genital or pelvic infection now or in the past -have more than one sexual partner or your partner has more than one partner -heart disease -history of an ectopic or tubal pregnancy -immune system problems -IUD in place -liver disease or tumor -problems with blood clots or take blood-thinners -use intravenous drugs -uterus of unusual shape -vaginal bleeding that has not been explained -an unusual or allergic reaction to levonorgestrel, other hormones, silicone, or polyethylene, medicines, foods, dyes, or preservatives -pregnant or trying to get pregnant -breast-feeding How should I use this medicine? This device is placed inside the uterus by a health care professional. Talk to your pediatrician regarding the use of this medicine in children. Special care may be needed. Overdosage: If you think you have taken too much of this medicine contact a poison control center or emergency room at once. NOTE: This medicine is only for you. Do not share this medicine with others. What if I miss a dose? This does not apply. What may interact with this medicine? Do not take this medicine with any of the following  medications: -amprenavir -bosentan -fosamprenavir This medicine may also interact with the following medications: -aprepitant -barbiturate medicines for inducing sleep or treating seizures -bexarotene -griseofulvin -medicines to treat seizures like carbamazepine, ethotoin, felbamate, oxcarbazepine, phenytoin, topiramate -modafinil -pioglitazone -rifabutin -rifampin -rifapentine -some medicines to treat HIV infection like atazanavir, indinavir, lopinavir, nelfinavir, tipranavir, ritonavir -St. John's wort -warfarin This list may not describe all possible interactions. Give your health care provider a list of all the medicines, herbs, non-prescription drugs, or dietary supplements you use. Also tell them if you smoke, drink alcohol, or use illegal drugs. Some items may interact with your medicine. What should I watch for while using this medicine? Visit your doctor or health care professional for regular check ups. See your doctor if you or your partner has sexual contact with others, becomes HIV positive, or gets a sexual transmitted disease. This product does not protect you against HIV infection (AIDS) or other sexually transmitted diseases. You can check the placement of the IUD yourself by reaching up to the top of your vagina with clean fingers to feel the threads. Do not pull on the threads. It is a good habit to check placement after each menstrual period. Call your doctor right away if you feel more of the IUD than just the threads or if you cannot feel the threads at all. The IUD may come out by itself. You may become pregnant if the device comes out. If you notice that the IUD has come out use a backup birth control method like condoms and call your health care provider. Using tampons will not change the position of the IUD and are okay to use during your   period. What side effects may I notice from receiving this medicine? Side effects that you should report to your doctor or  health care professional as soon as possible: -allergic reactions like skin rash, itching or hives, swelling of the face, lips, or tongue -fever, flu-like symptoms -genital sores -high blood pressure -no menstrual period for 6 weeks during use -pain, swelling, warmth in the leg -pelvic pain or tenderness -severe or sudden headache -signs of pregnancy -stomach cramping -sudden shortness of breath -trouble with balance, talking, or walking -unusual vaginal bleeding, discharge -yellowing of the eyes or skin Side effects that usually do not require medical attention (report to your doctor or health care professional if they continue or are bothersome): -acne -breast pain -change in sex drive or performance -changes in weight -cramping, dizziness, or faintness while the device is being inserted -headache -irregular menstrual bleeding within first 3 to 6 months of use -nausea This list may not describe all possible side effects. Call your doctor for medical advice about side effects. You may report side effects to FDA at 1-800-FDA-1088. Where should I keep my medicine? This does not apply. NOTE: This sheet is a summary. It may not cover all possible information. If you have questions about this medicine, talk to your doctor, pharmacist, or health care provider.    2016, Elsevier/Gold Standard. (2011-02-28 13:54:04)  

## 2015-10-04 NOTE — Progress Notes (Signed)
   [facility] Visit  Patient name: Tara Bush Frechette MRN [id number]  Date of birth: [birth year]  CC & HPI:  Tara Bush Dispenza is a 25 y.o. G0 Caucasian female presenting today for report of painful bump Lt labia Monday so bad she couldn't sit/stand/walk, pain went away yesterday and still gone today, but had already made appt so figured she'd keep it. When went to br here today for urine specimen, noticed bright red blood when she wiped and knows this is not her period. Does not remember when last period was- has pcos. Also wants to get on birth control, interested in IUD. Last sex 8/22, uses condoms currently. Wants help losing weigh, has been on paleo diet for months, has started exercising 3x/wk, has lost 20lbs, would like referral to nutritionist. Not interested in wt loss medications or bariatric surgery at this time. Told herself she was going to give it to the end of the year to see if she could be successful in weight loss, and if not would consider bariatric surgery. She does have CHTN is on HCTZ and hasn't taken in about 1wk b/c she forgets to take.  No LMP recorded. Patient is not currently having periods (Reason: Irregular Periods). The current method of family planning is condoms  Last pap 05/2014, neg  Pertinent History Reviewed:  Medical & Surgical Hx:   Past medical, surgical, family, and social history reviewed in electronic medical record Medications: Reviewed & Updated - see associated section Allergies: Reviewed in electronic medical record  Objective Findings:  Vitals: BP 140/84 (BP Location: Right Wrist, Patient Position: Sitting, Cuff Size: Large)   Pulse 76   Ht 5\' 7"  (1.702 m)   Wt (!) 364 lb 9.6 oz (165.4 kg)   BMI 57.10 kg/m  Body mass index is 57.1 kg/m.  Physical Examination: General appearance - alert, well appearing, and in no distress Pelvic - small slightly tender bump Lt labia w/ scab, bleeds slightly when squeezed. NO s/s infection, doesn't  appear to be boil.   Results for orders placed or performed in visit on 10/04/15 (from the past 24 hour(s))  POCT urine pregnancy   Collection Time: 10/04/15 10:04 AM  Result Value Ref Range   Preg Test, Ur Negative Negative     Assessment & Plan:  A:   Resolving Lt labial bump  Morbid obesity  HTN uncontrolled today as she is not taking her bp meds  Contraception counseling  PCOS  P:  Order placed for referral to nutritionist to discuss diet/wt loss  Set alarm to remind her to take bp meds q am- reiterated importance of bp control  GC/CT from urine (states she's never been screened)  Abstinence until IUD placement  Rx cytotec 400mcg po to take 2-3hrs prior to IUD insertion- do not take until we call her to let her know bhcg neg  Return in about 2 weeks (around 10/17/2015) for am for bhcg then late pm (3pm or after) for iud insertion.  Marge DuncansBooker, Zinedine Ellner Randall CNM, [facility]NP-BC 10/04/2015 4:08 PM

## 2015-10-05 LAB — GC/CHLAMYDIA PROBE AMP
CHLAMYDIA, DNA PROBE: NEGATIVE
Neisseria gonorrhoeae by PCR: NEGATIVE

## 2015-10-18 ENCOUNTER — Other Ambulatory Visit: Payer: BLUE CROSS/BLUE SHIELD

## 2015-10-18 ENCOUNTER — Encounter: Payer: Self-pay | Admitting: Women's Health

## 2015-10-18 ENCOUNTER — Ambulatory Visit (INDEPENDENT_AMBULATORY_CARE_PROVIDER_SITE_OTHER): Payer: BLUE CROSS/BLUE SHIELD | Admitting: Women's Health

## 2015-10-18 ENCOUNTER — Telehealth: Payer: Self-pay | Admitting: *Deleted

## 2015-10-18 VITALS — BP 122/94 | Wt 361.4 lb

## 2015-10-18 DIAGNOSIS — Z3040 Encounter for surveillance of contraceptives, unspecified: Secondary | ICD-10-CM

## 2015-10-18 DIAGNOSIS — Z30430 Encounter for insertion of intrauterine contraceptive device: Secondary | ICD-10-CM | POA: Insufficient documentation

## 2015-10-18 DIAGNOSIS — Z30014 Encounter for initial prescription of intrauterine contraceptive device: Secondary | ICD-10-CM | POA: Diagnosis not present

## 2015-10-18 LAB — BETA HCG QUANT (REF LAB): hCG Quant: <1 m[IU]/mL

## 2015-10-18 NOTE — Telephone Encounter (Signed)
Pt informed per Genella RifeK. Booker to take Cytotec before visit. Hcg neg.

## 2015-10-18 NOTE — Patient Instructions (Signed)
 Nothing in vagina for 3 days (no sex, douching, tampons, etc...)  Check your strings once a month to make sure you can feel them, if you are not able to please let us know  If you develop a fever of 100.4 or more in the next few weeks, or if you develop severe abdominal pain, please let us know  Use a backup method of birth control, such as condoms, for 2 weeks    Intrauterine Device Insertion, Care After Refer to this sheet in the next few weeks. These instructions provide you with information on caring for yourself after your procedure. Your health care provider may also give you more specific instructions. Your treatment has been planned according to current medical practices, but problems sometimes occur. Call your health care provider if you have any problems or questions after your procedure. WHAT TO EXPECT AFTER THE PROCEDURE Insertion of the IUD may cause some discomfort, such as cramping. The cramping should improve after the IUD is in place. You may have bleeding after the procedure. This is normal. It varies from light spotting for a few days to menstrual-like bleeding. When the IUD is in place, a string will extend past the cervix into the vagina for 1-2 inches. The strings should not bother you or your partner. If they do, talk to your health care provider.  HOME CARE INSTRUCTIONS   Check your intrauterine device (IUD) to make sure it is in place before you resume sexual activity. You should be able to feel the strings. If you cannot feel the strings, something may be wrong. The IUD may have fallen out of the uterus, or the uterus may have been punctured (perforated) during placement. Also, if the strings are getting longer, it may mean that the IUD is being forced out of the uterus. You no longer have full protection from pregnancy if any of these problems occur.  You may resume sexual intercourse if you are not having problems with the IUD. The copper IUD is considered immediately  effective, and the hormone IUD works right away if inserted within 7 days of your period starting. You will need to use a backup method of birth control for 7 days if the IUD in inserted at any other time in your cycle.  Continue to check that the IUD is still in place by feeling for the strings after every menstrual period.  You may need to take pain medicine such as acetaminophen or ibuprofen. Only take medicines as directed by your health care provider. SEEK MEDICAL CARE IF:   You have bleeding that is heavier or lasts longer than a normal menstrual cycle.  You have a fever.  You have increasing cramps or abdominal pain not relieved with medicine.  You have abdominal pain that does not seem to be related to the same area of earlier cramping and pain.  You are lightheaded, unusually weak, or faint.  You have abnormal vaginal discharge or smells.  You have pain during sexual intercourse.  You cannot feel the IUD strings, or the IUD string has gotten longer.  You feel the IUD at the opening of the cervix in the vagina.  You think you are pregnant, or you miss your menstrual period.  The IUD string is hurting your sex partner. MAKE SURE YOU:  Understand these instructions.  Will watch your condition.  Will get help right away if you are not doing well or get worse.   This information is not intended to replace   advice given to you by your health care provider. Make sure you discuss any questions you have with your health care provider.   Document Released: 09/26/2010 Document Revised: 11/18/2012 Document Reviewed: 07/19/2012 Elsevier Interactive Patient Education 2016 Elsevier Inc.  Levonorgestrel intrauterine device (IUD) What is this medicine? LEVONORGESTREL IUD (LEE voe nor jes trel) is a contraceptive (birth control) device. The device is placed inside the uterus by a healthcare professional. It is used to prevent pregnancy and can also be used to treat heavy bleeding  that occurs during your period. Depending on the device, it can be used for 3 to 5 years. This medicine may be used for other purposes; ask your health care provider or pharmacist if you have questions. What should I tell my health care provider before I take this medicine? They need to know if you have any of these conditions: -abnormal Pap smear -cancer of the breast, uterus, or cervix -diabetes -endometritis -genital or pelvic infection now or in the past -have more than one sexual partner or your partner has more than one partner -heart disease -history of an ectopic or tubal pregnancy -immune system problems -IUD in place -liver disease or tumor -problems with blood clots or take blood-thinners -use intravenous drugs -uterus of unusual shape -vaginal bleeding that has not been explained -an unusual or allergic reaction to levonorgestrel, other hormones, silicone, or polyethylene, medicines, foods, dyes, or preservatives -pregnant or trying to get pregnant -breast-feeding How should I use this medicine? This device is placed inside the uterus by a health care professional. Talk to your pediatrician regarding the use of this medicine in children. Special care may be needed. Overdosage: If you think you have taken too much of this medicine contact a poison control center or emergency room at once. NOTE: This medicine is only for you. Do not share this medicine with others. What if I miss a dose? This does not apply. What may interact with this medicine? Do not take this medicine with any of the following medications: -amprenavir -bosentan -fosamprenavir This medicine may also interact with the following medications: -aprepitant -barbiturate medicines for inducing sleep or treating seizures -bexarotene -griseofulvin -medicines to treat seizures like carbamazepine, ethotoin, felbamate, oxcarbazepine, phenytoin,  topiramate -modafinil -pioglitazone -rifabutin -rifampin -rifapentine -some medicines to treat HIV infection like atazanavir, indinavir, lopinavir, nelfinavir, tipranavir, ritonavir -St. John's wort -warfarin This list may not describe all possible interactions. Give your health care provider a list of all the medicines, herbs, non-prescription drugs, or dietary supplements you use. Also tell them if you smoke, drink alcohol, or use illegal drugs. Some items may interact with your medicine. What should I watch for while using this medicine? Visit your doctor or health care professional for regular check ups. See your doctor if you or your partner has sexual contact with others, becomes HIV positive, or gets a sexual transmitted disease. This product does not protect you against HIV infection (AIDS) or other sexually transmitted diseases. You can check the placement of the IUD yourself by reaching up to the top of your vagina with clean fingers to feel the threads. Do not pull on the threads. It is a good habit to check placement after each menstrual period. Call your doctor right away if you feel more of the IUD than just the threads or if you cannot feel the threads at all. The IUD may come out by itself. You may become pregnant if the device comes out. If you notice that the IUD has come out   use a backup birth control method like condoms and call your health care provider. Using tampons will not change the position of the IUD and are okay to use during your period. What side effects may I notice from receiving this medicine? Side effects that you should report to your doctor or health care professional as soon as possible: -allergic reactions like skin rash, itching or hives, swelling of the face, lips, or tongue -fever, flu-like symptoms -genital sores -high blood pressure -no menstrual period for 6 weeks during use -pain, swelling, warmth in the leg -pelvic pain or tenderness -severe or  sudden headache -signs of pregnancy -stomach cramping -sudden shortness of breath -trouble with balance, talking, or walking -unusual vaginal bleeding, discharge -yellowing of the eyes or skin Side effects that usually do not require medical attention (report to your doctor or health care professional if they continue or are bothersome): -acne -breast pain -change in sex drive or performance -changes in weight -cramping, dizziness, or faintness while the device is being inserted -headache -irregular menstrual bleeding within first 3 to 6 months of use -nausea This list may not describe all possible side effects. Call your doctor for medical advice about side effects. You may report side effects to FDA at 1-800-FDA-1088. Where should I keep my medicine? This does not apply. NOTE: This sheet is a summary. It may not cover all possible information. If you have questions about this medicine, talk to your doctor, pharmacist, or health care provider.    2016, Elsevier/Gold Standard. (2011-02-28 13:54:04)   

## 2015-10-18 NOTE — Progress Notes (Signed)
Tara Bush is a 25 y.o. year old G0 Caucasian female who presents for placement of a Mirena IUD. Offered Kyleena, discussed difference, prefers Mirena.   No LMP recorded. Patient is not currently having periods (Reason: Irregular Periods). BP (!) 122/94 (BP Location: Right Wrist, Patient Position: Sitting, Cuff Size: Large)   Wt (!) 361 lb 6.4 oz (163.9 kg)   BMI 56.60 kg/m  Last sexual intercourse was 8/22, and bhcg pregnancy test today was neg  The risks and benefits of the method and placement have been thouroughly reviewed with the patient and all questions were answered.  Specifically the patient is aware of failure rate of 02/998, expulsion of the IUD and of possible perforation.  The patient is aware of irregular bleeding due to the method and understands the incidence of irregular bleeding diminishes with time.  Signed copy of informed consent in chart.   Time out was performed.  A graves speculum was placed in the vagina.  The cervix was visualized, prepped using Betadine, and grasped with a single tooth tenaculum. The uterus was found to be neutral and it sounded to 7 cm.  Mirena IUD placed per manufacturer's recommendations.   The strings were trimmed to 3 cm.  Sonogram was performed and the proper placement of the IUD was verified via transvaginal u/s.   The patient was given post procedure instructions, including signs and symptoms of infection and to check for the strings after each menses or each month, and refraining from intercourse or anything in the vagina for 3 days.  She was given a Mirena care card with date IUD placed, and date IUD to be removed.  She is scheduled for a f/u appointment in 4 weeks.  Marge DuncansBooker, Adolphus Hanf Randall CNM, [facility]NP-BC 10/18/2015 3:54 PM

## 2015-10-20 ENCOUNTER — Ambulatory Visit: Payer: BLUE CROSS/BLUE SHIELD | Admitting: Dietician

## 2015-10-27 ENCOUNTER — Ambulatory Visit (HOSPITAL_COMMUNITY)
Admission: EM | Admit: 2015-10-27 | Discharge: 2015-10-27 | Disposition: A | Discharge status: Home / Self Care | Payer: BLUE CROSS/BLUE SHIELD | Attending: Family Medicine | Admitting: Family Medicine

## 2015-10-27 ENCOUNTER — Encounter (HOSPITAL_COMMUNITY): Payer: Self-pay | Admitting: Nurse Practitioner

## 2015-10-27 ENCOUNTER — Telehealth: Payer: Self-pay | Admitting: *Deleted

## 2015-10-27 DIAGNOSIS — R1011 Right upper quadrant pain: Secondary | ICD-10-CM | POA: Diagnosis not present

## 2015-10-27 LAB — POCT URINALYSIS DIP (DEVICE)
BILIRUBIN URINE: NEGATIVE
GLUCOSE, UA: NEGATIVE mg/dL
KETONES UR: NEGATIVE mg/dL
Nitrite: NEGATIVE
Protein, ur: NEGATIVE mg/dL
SPECIFIC GRAVITY, URINE: 1.02 (ref 1.005–1.030)
UROBILINOGEN UA: 0.2 mg/dL (ref 0.0–1.0)
pH: 7 (ref 5.0–8.0)

## 2015-10-27 MED ORDER — NAPROXEN 500 MG PO TABS: 500.0000 mg | ORAL_TABLET | Freq: Two times a day (BID) | ORAL | 0 refills | Status: DC | PRN

## 2015-10-27 NOTE — Discharge Instructions (Signed)
Recommend Naproxen twice a day as needed for pain. If pain gets more severe or any fever or vomiting occur, go to ER. Otherwise follow-up on Monday with your GYN for further evaluation.

## 2015-10-27 NOTE — ED Provider Notes (Signed)
CSN: [id number]     Arrival date & time 10/27/15  1219 History   First MD Initiated Contact with Patient 10/27/15 1327     Chief Complaint  Patient presents with  . Abdominal Pain   (Consider location/radiation/quality/duration/timing/severity/associated sxs/prior Treatment) 25 year old female presents with right sided lower to mid abdominal pain that radiates to her right flank/back and occasionally down her right leg. Pain is severe and she has taken Advil with minimal relief. She started with pelvic/abdominal cramping the day her IUD was placed which was 8 days ago on 10/18/15 and has not felt well since. She is nauseous due to pain intensity but denies any fever, vomiting, vaginal discharge or bleeding. She had a pregnancy test and GC/Chlmaydia testing before insertion which was all negative. She has not had intercourse since IUD placement. She had a difficult IUD insertion but U/S at time of insertion confirmed correct placement. She has contacted her GYN who recommended she come here or to the ER for further evaluation.       Past Medical History:  Diagnosis Date  . Dysfunctional uterine bleeding   . Hypertension    started lisinopril 1/16  . Obesity   . Post traumatic stress disorder (PTSD)    Past Surgical History:  Procedure Laterality Date  . TONSILLECTOMY AND ADENOIDECTOMY     Family History  Problem Relation Age of Onset  . Diabetes Mother   . Multiple sclerosis Mother   . Stroke Mother   . Hypertension Mother   . Hypertension Father   . Cancer Maternal Grandmother     ovarian/ uterine   . Diabetes Maternal Grandmother   . Stroke Maternal Grandfather   . Hypertension Maternal Grandfather   . Aneurysm Maternal Grandfather    Social History  Substance Use Topics  . Smoking status: Never Smoker  . Smokeless tobacco: Never Used  . Alcohol use No   OB History    No data available     Review of Systems  Constitutional: Negative for chills and fever.   Respiratory: Negative for chest tightness and shortness of breath.   Cardiovascular: Negative for chest pain.  Gastrointestinal: Positive for abdominal pain and nausea. Negative for blood in stool, constipation, diarrhea and vomiting.  Genitourinary: Positive for flank pain. Negative for difficulty urinating, dysuria, hematuria, urgency, vaginal bleeding and vaginal discharge.  Skin: Negative for rash and wound.  Neurological: Negative for weakness and headaches.  Hematological: Negative.     Allergies  Review of patient's allergies indicates no known allergies.  Home Medications   Prior to Admission medications   Medication Sig Start Date End Date Taking? Authorizing Provider  aspirin-acetaminophen-caffeine (EXCEDRIN MIGRAINE) [phone] MG tablet Take 1 tablet by mouth every 6 (six) hours as needed for headache.    Historical Provider, MD  hydrochlorothiazide (HYDRODIURIL) 25 MG tablet Take 1 tablet (25 mg total) by mouth daily. 03/30/15   Johna Sheriff, MD  naproxen (NAPROSYN) 500 MG tablet Take 1 tablet (500 mg total) by mouth 2 (two) times daily as needed for moderate pain. 10/27/15   Sudie Grumbling, NP   Meds Ordered and Administered this Visit  Medications - No data to display  BP (!) 180/107   Pulse 91   Temp 98 F (36.7 C) (Oral)   Resp 17   SpO2 98%  No data found.   Physical Exam  Constitutional: She is oriented to person, place, and time. She appears well-developed and well-nourished. She appears distressed (pt appears uncomfortable  due to pain).  HENT:  Mouth/Throat: Oropharynx is clear and moist.  Cardiovascular: Normal rate, regular rhythm and normal heart sounds.   Pulmonary/Chest: Effort normal and breath sounds normal. No respiratory distress.  Abdominal: Soft. Normal appearance and bowel sounds are normal. There is no hepatosplenomegaly. There is tenderness in the right upper quadrant, right lower quadrant, epigastric area, periumbilical area and suprapubic  area. There is rebound, guarding and CVA tenderness (right). There is no rigidity, no tenderness at McBurney's point and negative Murphy's sign.  Genitourinary:  Genitourinary Comments: Pt refused pelvic exam  Neurological: She is alert and oriented to person, place, and time.  Skin: Skin is warm and dry.  Psychiatric: She has a normal mood and affect. Her behavior is normal. Judgment and thought content normal.    Urgent Care Course   Clinical Course    Procedures (including critical care time)  Labs Review Labs Reviewed  POCT URINALYSIS DIP (DEVICE) - Abnormal; Notable for the following:       Result Value   Hgb urine dipstick TRACE (*)    Leukocytes, UA TRACE (*)    All other components within normal limits    Imaging Review No results found.   Visual Acuity Review  Right Eye Distance:   Left Eye Distance:   Bilateral Distance:    Right Eye Near:   Left Eye Near:    Bilateral Near:         MDM   1. Right upper quadrant pain    Reviewed urinalysis results with patient and her husband- most likely not a UTI. Discussed that pain could be due to various causes- may be a kidney stone, appendicitis, possible uterine or pelvic infection or IUD perforation; however patient does not have a fever.  BP elevated due to pain level. Reviewed with patient and her husband that additional testing with imaging needs to be done to help determine cause of pain. Patient refused pelvic exam here to check for IUD string placement. Offered Toradol for pain but she refused injections. Pain level had decreased to about a 6/10 while waiting in room. Patient desires oral anti-inflammatories so Rx for Naproxen 500mg  twice a day as needed for pain was written. Strongly suggested patient go to ER for further evaluation. Patient still hesitant and may wait until Monday (3 days) to follow-up with her GYN for U/S and pelvic exam if pain does not become more severe. Discussed risks of waiting and  patient expresses verbal understanding. Follow-up with her GYN or go to ER as suggested.     Sudie GrumblingAnn Berry Aldine Grainger, NP 10/28/15 [phone]

## 2015-10-27 NOTE — ED Triage Notes (Signed)
Pt c/o 2 day history R side pain. Pain radiates into her R flank and down R leg. Describes as a 10/10 stabbing pain. The pain is relieved by advil but then returns. The pain has been increasing in frequency and duration since onset. She reports nausea. She denies any fevers, vomiting, bowel or bladder changes, vaginal discharge or bleeding. The pain began after she had an IUD placed on Wednesday. Her GYN referred her here today.

## 2015-10-27 NOTE — Telephone Encounter (Signed)
Spoke with pt. Pt had an IUD placed last Wednesday. She is in a lot of pain in right side. [place] I spoke with JAG. Since pt can't get here, she was advised to go to nearest ER or Urgent Care. Pt voiced understanding. JSY

## 2015-11-15 ENCOUNTER — Ambulatory Visit: Payer: BLUE CROSS/BLUE SHIELD | Admitting: Women's Health

## 2015-12-22 ENCOUNTER — Other Ambulatory Visit: Payer: Self-pay | Admitting: Pediatrics

## 2015-12-22 DIAGNOSIS — I10 Essential (primary) hypertension: Secondary | ICD-10-CM

## 2015-12-27 ENCOUNTER — Encounter: Payer: Self-pay | Admitting: Pediatrics

## 2015-12-27 ENCOUNTER — Ambulatory Visit (INDEPENDENT_AMBULATORY_CARE_PROVIDER_SITE_OTHER): Payer: BLUE CROSS/BLUE SHIELD | Admitting: Pediatrics

## 2015-12-27 VITALS — BP 171/102 | HR 97 | Temp 90.0°F | Ht 67.0 in | Wt 371.6 lb

## 2015-12-27 DIAGNOSIS — L739 Follicular disorder, unspecified: Secondary | ICD-10-CM

## 2015-12-27 DIAGNOSIS — I10 Essential (primary) hypertension: Secondary | ICD-10-CM

## 2015-12-27 DIAGNOSIS — F411 Generalized anxiety disorder: Secondary | ICD-10-CM | POA: Diagnosis not present

## 2015-12-27 MED ORDER — HYDROCHLOROTHIAZIDE 25 MG PO TABS: 25.0000 mg | ORAL_TABLET | Freq: Every day | ORAL | 1 refills | Status: DC

## 2015-12-27 MED ORDER — CEPHALEXIN 500 MG PO CAPS
500.0000 mg | ORAL_CAPSULE | Freq: Two times a day (BID) | ORAL | 0 refills | Status: DC
Start: 2015-12-27 — End: 2016-03-22

## 2015-12-27 MED ORDER — CITALOPRAM HYDROBROMIDE 20 MG PO TABS: 20.0000 mg | ORAL_TABLET | Freq: Every day | ORAL | 3 refills | Status: DC

## 2015-12-27 MED ORDER — HYDROXYZINE HCL 10 MG PO TABS: 10.0000 mg | ORAL_TABLET | Freq: Three times a day (TID) | ORAL | 0 refills | Status: DC | PRN

## 2015-12-27 NOTE — Progress Notes (Signed)
  Subjective:   Patient ID: Tara Bush, female    DOB: Apr 23, 1990, 23 y.o.   MRN: [id number] CC: Rash (Left Breast)  HPI: Tara AmenHarley N Faries is a 25 y.o. female presenting for Rash (Left Breast)  Started yesterday Used OTC antibiotic cream, looks a better today than yesterday Over L breast, red, inflamed No fevers Feeling well otherwise  No HA, no vision changes, no CP  Remains very stressed re work Mood has been fine but has a hard time turning off worrying Working long hours now  Relevant past medical, surgical, family and social history reviewed. Allergies and medications reviewed and updated. History  Smoking Status  . Never Smoker  Smokeless Tobacco  . Never Used   ROS: Per HPI   Objective:    BP (!) 171/102   Pulse 97   Temp (!) 90 F (32.2 C)   Ht 5\' 7"  (1.702 m)   Wt (!) 371 lb 9.6 oz (168.6 kg)   BMI 58.20 kg/m   Wt Readings from Last 3 Encounters:  12/27/15 (!) 371 lb 9.6 oz (168.6 kg)  10/18/15 (!) 361 lb 6.4 oz (163.9 kg)  10/04/15 (!) 364 lb 9.6 oz (165.4 kg)    Gen: NAD, alert, cooperative with exam, NCAT EYES: EOMI, no conjunctival injection, or no icterus CV: NRRR, normal S1/S2 Resp: CTABL, no wheezes, normal WOB Neuro: Alert and oriented Skin: L breast with red follicles over primarily upper L quadrant. No induration, no tenderness. One apprx 4mm crusted yellow superficial plaque upper lateral quadrant of L [place] Psych: normal affect, mood is "stressed"  Assessment & Plan:  Lane HackerHarley was seen today for rash.  Diagnoses and all orders for this visit:  Folliculitis -     cephALEXin (KEFLEX) 500 MG capsule; Take 1 capsule (500 mg total) by mouth 2 (two) times daily.  Essential hypertension Uncontrolled Check at home Let me know if remains elevated -     hydrochlorothiazide (HYDRODIURIL) 25 MG tablet; Take 1 tablet (25 mg total) by mouth daily.  Generalized anxiety disorder Discussed stress relief Start below Feels safe at home, no  thoughts of self harm Again encouraged counseling, has been in it before, has not had CBT for anxiety before -     hydrOXYzine (ATARAX/VISTARIL) 10 MG tablet; Take 1 tablet (10 mg total) by mouth 3 (three) times daily as needed. -     citalopram (CELEXA) 20 MG tablet; Take 1 tablet (20 mg total) by mouth daily. Take half tab for 8 days   Follow up plan: Return in about 2 months (around 02/26/2016). Rex Krasarol Tahjae Clausing, MD [facility]

## 2015-12-27 NOTE — Patient Instructions (Signed)
Cognitive behavioral therapy  Your provider wants you to schedule an appointment with a Psychologist/Psychiatrist. The following list of offices requires the patient to call and make their own appointment, as there is information they need that only you can provide. Please feel free to choose form the following providers:  [facility]   [phone] Crisis [place] [phone]  [facility]  [phone]   [address] [place], [place]  (Scheduled through [facility]) Must call and do an interview for appointment. Sees Children / Accepts Medicaid  [facility]    [phone]  [address], [address]    [place], [place]       [facility]  [phone] [address] [place], [place]  Evaluates for Autism but does not treat it Sees Children / Accepts Medicaid  [facility]    [phone] [address], [address]   [place], [place] Medication management, substance abuse, bipolar, grief, family, marriage, OCD, anxiety, PTSD Sees children / Accepts Medicaid  [facility]    [phone] [address] [place] Sees children / [facility]s [facility]  [facility]  [phone] [address] [place], [place]   Dr Estelle GrumblesAkinlayo     [phone] [address] [place], [place]  Sees ADD & ADHD for treatment Accepts Medicaid  [facility]  [phone] [place], [place] Evaluates for Autism [facility]s [facility]  [facility]   [phone] [address] [place], [place]  Does Adult ADD evaluations Does not accept Medicaid  [facility]   [phone] [address]   [place], [place] Uses animal therapy  Sees children as young as 23 years old Accepts Medicaid

## 2016-02-07 ENCOUNTER — Other Ambulatory Visit: Payer: Self-pay | Admitting: Pediatrics

## 2016-02-07 DIAGNOSIS — F411 Generalized anxiety disorder: Secondary | ICD-10-CM

## 2016-03-22 ENCOUNTER — Ambulatory Visit (INDEPENDENT_AMBULATORY_CARE_PROVIDER_SITE_OTHER): Payer: BLUE CROSS/BLUE SHIELD | Admitting: Pediatrics

## 2016-03-22 ENCOUNTER — Encounter: Payer: Self-pay | Admitting: Pediatrics

## 2016-03-22 VITALS — BP 142/78 | HR 89 | Temp 98.3°F | Ht 67.0 in | Wt 384.4 lb

## 2016-03-22 DIAGNOSIS — Z6841 Body Mass Index (BMI) 40.0 and over, adult: Secondary | ICD-10-CM

## 2016-03-22 DIAGNOSIS — I10 Essential (primary) hypertension: Secondary | ICD-10-CM | POA: Diagnosis not present

## 2016-03-22 DIAGNOSIS — Z23 Encounter for immunization: Secondary | ICD-10-CM

## 2016-03-22 DIAGNOSIS — F411 Generalized anxiety disorder: Secondary | ICD-10-CM

## 2016-03-22 LAB — BAYER DCA HB A1C WAIVED: HB A1C (BAYER DCA - WAIVED): 5.3 % (ref <7.0)

## 2016-03-22 MED ORDER — CITALOPRAM HYDROBROMIDE 40 MG PO TABS: 40.0000 mg | ORAL_TABLET | Freq: Every day | ORAL | 3 refills | Status: DC

## 2016-03-22 NOTE — Progress Notes (Signed)
  Subjective:   Patient ID: Tara Bush, female    DOB: 1990-12-28, 26 y.o.   MRN: [id number] CC: Follow-up (Anxiety)  HPI: Tara Bush is a 26 y.o. female presenting for Follow-up (Anxiety)  Anxiety: Has noticed a difference on the celexa, thinks mood has much more stable Coworker died unexpectedly earlier this week, mood has been down today About two weeks ago stopped taking hydroxyzine which she had been taking  HTN: Ran out of HCTZ yesterday, didn't take today No CP, no HA, no SOB  Work remains very busy Visiting family this weekend, worried about that   Elevated BMI: Has been trying to make lifestyle changes Has been using ellipitical most mornings at home Drinks water all day Eats in mostly, decreased eating out At work lots of snacking, has potlucks regularly Says she is a social drinker, several drinks at a time at least once a week with work  Remains very interested in losing weight  Relevant past medical, surgical, family and social history reviewed. Allergies and medications reviewed and updated. History  Smoking Status  . Never Smoker  Smokeless Tobacco  . Never Used   ROS: Per HPI   Objective:    BP (!) 142/78   Pulse 89   Temp 98.3 F (36.8 C) (Oral)   Ht 5' 7" (1.702 m)   Wt (!) 384 lb 6.4 oz (174.4 kg)   BMI 60.21 kg/m   Wt Readings from Last 3 Encounters:  03/22/16 (!) 384 lb 6.4 oz (174.4 kg)  12/27/15 (!) 371 lb 9.6 oz (168.6 kg)  10/18/15 (!) 361 lb 6.4 oz (163.9 kg)    Gen: NAD, alert, cooperative with exam, NCAT EYES: EOMI, no conjunctival injection, or no icterus ENT: OP without erythema LYMPH: no cervical LAD CV: NRRR, normal S1/S2, no murmur, distal pulses 2+ b/l Resp: CTABL, no wheezes, normal WOB Abd: +BS, soft, NTND. no guarding or organomegaly Ext: No edema, warm Neuro: Alert and oriented MSK: normal muscle bulk Psych: nl affect  Assessment & Plan:  Tara Bush was seen today for follow-up multiple med  problems.  Diagnoses and all orders for this visit:  Essential hypertension Elevated today Missed medicine this morning Check BP at home Let me know if stays elevated -     CMP14+EGFR  Generalized anxiety disorder Some ongoing symptoms, increase to 64m daily -     citalopram (CELEXA) 40 MG tablet; Take 1 tablet (40 mg total) by mouth daily.  Encounter for immunization -     Flu Vaccine QUAD 36+ mos IM  Morbid obesity with BMI of 60.0-69.9, adult (HCC) Pt has been making lifestyle changes, increasing activity, eating out less Pt is interested in weight loss surgery Check below labs, refer to gen surgery for weight loss surgeyr evaluation -     Bayer DCA Hb A1c Waived -     Lipid panel -     CBC with Differential/Platelet -     TSH -     Ambulatory referral to General Surgery   Follow up plan: Return in about 4 weeks (around 04/19/2016). CAssunta Found MD [place]

## 2016-03-23 LAB — CBC WITH DIFFERENTIAL/PLATELET
BASOS ABS: 0.1 10*3/uL (ref 0.0–0.2)
Basos: 1 %
EOS (ABSOLUTE): 0.1 10*3/uL (ref 0.0–0.4)
Eos: 1 %
HEMOGLOBIN: 12.6 g/dL (ref 11.1–15.9)
Hematocrit: 41.8 % (ref 34.0–46.6)
IMMATURE GRANS (ABS): 0 10*3/uL (ref 0.0–0.1)
Immature Granulocytes: 0 %
LYMPHS: 23 %
Lymphocytes Absolute: 2.4 10*3/uL (ref 0.7–3.1)
MCH: 25.5 pg — ABNORMAL LOW (ref 26.6–33.0)
MCHC: 30.1 g/dL — AB (ref 31.5–35.7)
MCV: 85 fL (ref 79–97)
MONOCYTES: 7 %
Monocytes Absolute: 0.7 10*3/uL (ref 0.1–0.9)
NEUTROS ABS: 7.2 10*3/uL — AB (ref 1.4–7.0)
Neutrophils: 68 %
PLATELETS: 380 10*3/uL — AB (ref 150–379)
RBC: 4.94 x10E6/uL (ref 3.77–5.28)
RDW: 15.9 % — AB (ref 12.3–15.4)
WBC: 10.4 10*3/uL (ref 3.4–10.8)

## 2016-03-23 LAB — CMP14+EGFR
A/G RATIO: 1.3 (ref 1.2–2.2)
ALK PHOS: 96 IU/L (ref 39–117)
ALT: 24 IU/L (ref 0–32)
AST: 14 IU/L (ref 0–40)
Albumin: 4 g/dL (ref 3.5–5.5)
BILIRUBIN TOTAL: 0.4 mg/dL (ref 0.0–1.2)
BUN/Creatinine Ratio: 25 — ABNORMAL HIGH (ref 9–23)
BUN: 18 mg/dL (ref 6–20)
CO2: 24 mmol/L (ref 18–29)
Calcium: 9.5 mg/dL (ref 8.7–10.2)
Chloride: 100 mmol/L (ref 96–106)
Creatinine, Ser: 0.73 mg/dL (ref 0.57–1.00)
GFR calc Af Amer: 134 mL/min/{1.73_m2} (ref >59)
GFR, EST NON AFRICAN AMERICAN: 116 mL/min/{1.73_m2} (ref >59)
GLOBULIN, TOTAL: 3 g/dL (ref 1.5–4.5)
Glucose: 95 mg/dL (ref 65–99)
POTASSIUM: 3.7 mmol/L (ref 3.5–5.2)
SODIUM: 142 mmol/L (ref 134–144)
Total Protein: 7 g/dL (ref 6.0–8.5)

## 2016-03-23 LAB — LIPID PANEL
Chol/HDL Ratio: 4.5 ratio units — ABNORMAL HIGH (ref 0.0–4.4)
Cholesterol, Total: 162 mg/dL (ref 100–199)
HDL: 36 mg/dL — ABNORMAL LOW (ref >39)
LDL Calculated: 104 mg/dL — ABNORMAL HIGH (ref 0–99)
TRIGLYCERIDES: 111 mg/dL (ref 0–149)
VLDL Cholesterol Cal: 22 mg/dL (ref 5–40)

## 2016-03-23 LAB — TSH: TSH: 2.85 u[IU]/mL (ref 0.450–4.500)

## 2016-05-01 ENCOUNTER — Other Ambulatory Visit: Payer: Self-pay

## 2016-05-01 DIAGNOSIS — F411 Generalized anxiety disorder: Secondary | ICD-10-CM

## 2016-05-01 MED ORDER — CITALOPRAM HYDROBROMIDE 40 MG PO TABS: 40.0000 mg | ORAL_TABLET | Freq: Every day | ORAL | 0 refills | Status: DC

## 2016-05-08 ENCOUNTER — Telehealth: Payer: Self-pay | Admitting: *Deleted

## 2016-05-08 ENCOUNTER — Encounter: Payer: Self-pay | Admitting: Pediatrics

## 2016-05-08 ENCOUNTER — Ambulatory Visit (INDEPENDENT_AMBULATORY_CARE_PROVIDER_SITE_OTHER): Payer: BLUE CROSS/BLUE SHIELD | Admitting: Pediatrics

## 2016-05-08 VITALS — BP 170/119 | HR 82 | Temp 97.6°F | Ht 67.0 in | Wt 390.6 lb

## 2016-05-08 DIAGNOSIS — K047 Periapical abscess without sinus: Secondary | ICD-10-CM

## 2016-05-08 DIAGNOSIS — L0291 Cutaneous abscess, unspecified: Secondary | ICD-10-CM | POA: Diagnosis not present

## 2016-05-08 DIAGNOSIS — I10 Essential (primary) hypertension: Secondary | ICD-10-CM

## 2016-05-08 MED ORDER — NAPROXEN 500 MG PO TABS: 500.0000 mg | ORAL_TABLET | Freq: Two times a day (BID) | ORAL | 0 refills | Status: DC

## 2016-05-08 MED ORDER — HYDROCODONE-ACETAMINOPHEN 5-325 MG PO TABS: 1.0000 | ORAL_TABLET | Freq: Three times a day (TID) | ORAL | 0 refills | Status: DC | PRN

## 2016-05-08 MED ORDER — AMOXICILLIN-POT CLAVULANATE 875-125 MG PO TABS: 1.0000 | ORAL_TABLET | Freq: Two times a day (BID) | ORAL | 0 refills | Status: DC

## 2016-05-08 NOTE — Progress Notes (Signed)
  Subjective:   Patient ID: Tara Bush, female    DOB: 1990/08/28, 26 y.o.   MRN: [id number] CC: Infected tooth  HPI: Tara AmenHarley N Bush is a 26 y.o. female presenting for Infected tooth  6 days ago started having tooth pain Noticed pouch of fluid on roof of mouth [facility] gave dose of IV abx and sent home with amoxicillin and 5 pain pills, also taking ibuprofen, BC powders Has appt 4/10 to have tooth pulled Last night had a lot of pain in tooth, felt the "pocket" of fluid again on the roof of her mouth Has been taking amoxicillin 500mg  TID past 3 days  Decreased EtOH intake Taking BP med daily Not checking Bps at home Thinks BP is up now because she fell down the stairs when rushing to get here  Noticed knot on back of neck last night No drainage  Relevant past medical, surgical, family and social history reviewed. Allergies and medications reviewed and updated. History  Smoking Status  . Never Smoker  Smokeless Tobacco  . Never Used   ROS: Per HPI   Objective:    BP (!) 170/119   Pulse 82   Temp 97.6 F (36.4 C) (Oral)   Ht 5\' 7"  (1.702 m)   Wt (!) 390 lb 9.6 oz (177.2 kg)   BMI 61.18 kg/m   Wt Readings from Last 3 Encounters:  05/08/16 (!) 390 lb 9.6 oz (177.2 kg)  03/22/16 (!) 384 lb 6.4 oz (174.4 kg)  12/27/15 (!) 371 lb 9.6 oz (168.6 kg)    Gen: NAD, alert, cooperative with exam, NCAT EYES: EOMI, no conjunctival injection, or no icterus ENT:  OP without erythema, inner posterior L upper gum line above back molars ttp, no flutuance LYMPH: no cervical LAD CV: NRRR, normal S1/S2, no murmur, distal pulses 2+ b/l Resp: CTABL, no wheezes, normal WOB Neuro: Alert and oriented Skin: acanthosis nigricans back of neck, <1cm knot in skin, mucopurulent bloody discharge easily expressed from knot  Assessment & Plan:  Tara Bush was seen today for infected tooth.  Diagnoses and all orders for this visit:  Dental infection Increased pain, return of  fluctance Switch to augmentin Naproxen for pain, hydrocodone dditional #5 tabs given for severe pain f/u with dentist in 2 weeks as scheduled -     naproxen (NAPROSYN) 500 MG tablet; Take 1 tablet (500 mg total) by mouth 2 (two) times daily with a meal. -     amoxicillin-clavulanate (AUGMENTIN) 875-125 MG tablet; Take 1 tablet by mouth 2 (two) times daily. -     HYDROcodone-acetaminophen (NORCO/VICODIN) 5-325 MG tablet; Take 1 tablet by mouth every 8 (eight) hours as needed for moderate pain.  Abscess Draining small abscess back of neck Warm compresses  Essential hypertension Remains elevated today Pt to check at home, rtc 4 weeks Cont current med If bp remains high at home let me know  Follow up plan: Return in about 4 weeks (around 06/05/2016). Rex Krasarol Carri Spillers, MD [facility]

## 2016-05-08 NOTE — Patient Instructions (Signed)
Check blood pressures every morning Goal 120s/70s

## 2016-05-08 NOTE — Telephone Encounter (Signed)
Error

## 2016-05-21 ENCOUNTER — Ambulatory Visit: Payer: BLUE CROSS/BLUE SHIELD | Admitting: Pediatrics

## 2016-06-05 ENCOUNTER — Ambulatory Visit: Payer: BLUE CROSS/BLUE SHIELD | Admitting: Pediatrics

## 2016-06-06 ENCOUNTER — Telehealth: Payer: Self-pay | Admitting: Pediatrics

## 2016-06-06 ENCOUNTER — Encounter: Payer: Self-pay | Admitting: Pediatrics

## 2016-06-19 ENCOUNTER — Other Ambulatory Visit: Payer: Self-pay | Admitting: Pediatrics

## 2016-06-19 DIAGNOSIS — I10 Essential (primary) hypertension: Secondary | ICD-10-CM

## 2016-09-19 ENCOUNTER — Other Ambulatory Visit: Payer: Self-pay | Admitting: Pediatrics

## 2016-09-19 DIAGNOSIS — F411 Generalized anxiety disorder: Secondary | ICD-10-CM

## 2016-10-31 ENCOUNTER — Ambulatory Visit: Payer: BLUE CROSS/BLUE SHIELD | Admitting: Pediatrics

## 2016-10-31 ENCOUNTER — Encounter: Payer: Self-pay | Admitting: Pediatrics

## 2016-11-06 ENCOUNTER — Ambulatory Visit: Payer: 59 | Admitting: Pediatrics

## 2016-11-21 ENCOUNTER — Ambulatory Visit (INDEPENDENT_AMBULATORY_CARE_PROVIDER_SITE_OTHER): Payer: 59 | Admitting: Women's Health

## 2016-11-21 ENCOUNTER — Encounter: Payer: Self-pay | Admitting: Obstetrics and Gynecology

## 2016-11-21 VITALS — BP 130/80 | HR 94 | Ht 66.0 in | Wt 396.4 lb

## 2016-11-21 DIAGNOSIS — Z975 Presence of (intrauterine) contraceptive device: Secondary | ICD-10-CM

## 2016-11-21 DIAGNOSIS — N921 Excessive and frequent menstruation with irregular cycle: Secondary | ICD-10-CM

## 2016-11-21 DIAGNOSIS — Z30431 Encounter for routine checking of intrauterine contraceptive device: Secondary | ICD-10-CM | POA: Diagnosis not present

## 2016-11-21 LAB — POCT WET PREP (WET MOUNT)
Clue Cells Wet Prep Whiff POC: NEGATIVE
TRICHOMONAS WET PREP HPF POC: ABSENT

## 2016-11-21 NOTE — Progress Notes (Signed)
   GYN Visit  Patient name: Tara Bush MRN [id number]  Date of birth: [birth year] CC & HPI:  Tara Bush is a 26 y.o. G0P0000 Caucasian female being seen today for IUD check. Mirena IUD inserted 10/18/15. Had some pain for few weeks after insertion, then went away. No period since insertion. Unable to feel strings. Had sex 2d ago and felt pressure/pain and then had bright red bleeding- both subsided rather quickly and is not having any problems at all today. No pain/bleeding with prior sex since IUD inserted. Denies abnormal discharge, itching/odor/irritation.   No LMP recorded. Patient is not currently having periods (Reason: IUD). The current method of family planning is IUD. Last pap April 2016. Results were:  normal Review of Systems:  Denies any headaches, blurred vision, fatigue, shortness of breath, chest pain, abdominal pain, abnormal vaginal discharge/itching/odor/irritation, problems with periods, bowel movements, or urination unless otherwise stated above.  Pertinent History Reviewed:  Reviewed past medical,surgical, social and family history.  Reviewed problem list, medications and allergies. Objective Findings:    Vitals:   11/21/16 0840  BP: 130/80  Pulse: 94  Weight: (!) 396 lb 6.4 oz (179.8 kg)  Height:  (1.676 m)  Body mass index is 63.98 kg/m.  Physical Examination:  General appearance - well appearing, and in no distress  Mental status - alert, oriented to person, place, and time  Skin: Warm & dry  Pelvic - VULVA: normal appearing vulva with no masses, tenderness or lesions, VAGINA: normal appearing vagina with small amt light brown nonodorous discharge,no lesions, CERVIX: normal appearing cervix without discharge or lesions, IUD strings visible and appropriate length, no part of IUD itself visible.   Informal transvaginal u/s shows IUD in correct fundal placement  Results for orders placed or performed in visit on 11/21/16 (from the past 24 hour(s))   POCT Wet Prep [facility] [place])   Collection Time: 11/21/16  9:54 AM  Result Value Ref Range   Source Wet Prep POC vaginal    WBC, Wet Prep HPF POC few    Bacteria Wet Prep HPF POC None (A) Few   BACTERIA WET PREP MORPHOLOGY POC     Clue Cells Wet Prep HPF POC None None   Clue Cells Wet Prep Whiff POC Negative Whiff    Yeast Wet Prep HPF POC None    KOH Wet Prep POC     Trichomonas Wet Prep HPF POC Absent Absent    Assessment & Plan:   1) IUD check> IUD in correct placement  2) Pain/bleeding w/ sex x 1> normal vaginal discharge, IUD correct placement- to monitor, if continues to happen let us know  Orders Placed This Encounter  Procedures  . POCT Wet Prep [facility])    Return for April , Pap & physical.  Marge Duncans CNM, [facility] 11/21/2016 9:57 AM

## 2016-11-23 ENCOUNTER — Encounter: Payer: Self-pay | Admitting: Pediatrics

## 2017-02-25 ENCOUNTER — Other Ambulatory Visit: Payer: Self-pay | Admitting: Pediatrics

## 2017-02-25 DIAGNOSIS — I10 Essential (primary) hypertension: Secondary | ICD-10-CM

## 2017-02-26 ENCOUNTER — Other Ambulatory Visit: Payer: Self-pay | Admitting: Pediatrics

## 2017-02-26 DIAGNOSIS — F411 Generalized anxiety disorder: Secondary | ICD-10-CM

## 2017-03-18 DIAGNOSIS — G478 Other sleep disorders: Secondary | ICD-10-CM | POA: Diagnosis not present

## 2017-03-18 DIAGNOSIS — I10 Essential (primary) hypertension: Secondary | ICD-10-CM | POA: Diagnosis not present

## 2017-06-18 DIAGNOSIS — K219 Gastro-esophageal reflux disease without esophagitis: Secondary | ICD-10-CM | POA: Diagnosis not present

## 2017-06-18 DIAGNOSIS — R110 Nausea: Secondary | ICD-10-CM | POA: Diagnosis not present

## 2017-06-18 DIAGNOSIS — M542 Cervicalgia: Secondary | ICD-10-CM | POA: Diagnosis not present

## 2018-01-20 ENCOUNTER — Ambulatory Visit (INDEPENDENT_AMBULATORY_CARE_PROVIDER_SITE_OTHER): Payer: 59 | Admitting: Women's Health

## 2018-01-20 ENCOUNTER — Encounter: Payer: Self-pay | Admitting: Women's Health

## 2018-01-20 VITALS — BP 157/101 | HR 70 | Ht 66.5 in | Wt 369.8 lb

## 2018-01-20 DIAGNOSIS — I10 Essential (primary) hypertension: Secondary | ICD-10-CM

## 2018-01-20 DIAGNOSIS — Z3009 Encounter for other general counseling and advice on contraception: Secondary | ICD-10-CM | POA: Diagnosis not present

## 2018-01-20 NOTE — Patient Instructions (Signed)
Call your family doctor today to let her know what your blood pressures were today: 158/104, 157/101

## 2018-01-20 NOTE — Progress Notes (Signed)
   GYN VISIT Patient name: Tara Bush MRN [id number]  Date of birth: 05-18-1990 Chief Complaint:   IUD removal  History of Present Illness:   Tara Bush is a 27 y.o. G0P0000 Caucasian female being seen today initially wanting Mirena IUD removed. Was placed 10/18/15. States she has been doing some research and read it could take 6-48mths after removal for hormones to regulate and become pregnant. She's thinking she may want to try for pregnancy next Fall. Doing weight watchers, has lost 61lbs! CHTN on HCTZ, took today, thinks she was just anxious about today's visit. BPs are normally good when she goes to her PCP. Does have PCOS.      No LMP recorded. (Menstrual status: IUD). The current method of family planning is IUD. Last pap 06/07/14. Results were:  normal Review of Systems:   Pertinent items are noted in HPI Denies fever/chills, dizziness, headaches, visual disturbances, fatigue, shortness of breath, chest pain, abdominal pain, vomiting, abnormal vaginal discharge/itching/odor/irritation, problems with periods, bowel movements, urination, or intercourse unless otherwise stated above.  Pertinent History Reviewed:  Reviewed past medical,surgical, social, obstetrical and family history.  Reviewed problem list, medications and allergies. Physical Assessment:   Vitals:   01/20/18 1342 01/20/18 1347  BP: (!) 158/104 (!) 157/101  Pulse: 75 70  Weight: (!) 369 lb 12.8 oz (167.7 kg)   Height: 5' 6.5" (1.689 m)   Body mass index is 58.79 kg/m.       Physical Examination:   General appearance: alert, well appearing, and in no distress  Mental status: alert, oriented to person, place, and time  Skin: warm & dry   Cardiovascular: normal heart rate noted  Respiratory: normal respiratory effort, no distress  Abdomen: soft, non-tender   Pelvic: examination not indicated  Extremities: no edema   No results found for this or any previous visit (from the past 24 hour(s)).    Assessment & Plan:  1) Contraception counseling> recommended leaving IUD in for now until actually ready to conceive, as fertility can potentially return as soon as 1wk after removal. And as she is still obese, she should continue weight watchers and get to her goal weight before trying to conceive.   2) CHTN> BP elevated today, managed by PCP on HCTZ 25mg , to call PCP today and let her know what bp's here were today  3) Past due for pap> schedule  Meds: No orders of the defined types were placed in this encounter.   No orders of the defined types were placed in this encounter.   Return in about 1 month (around 02/20/2018) for Pap & physical.  Cheral MarkerKimberly R Sami Roes CNM, [facility]NP-BC 01/20/2018 2:05 PM

## 2018-01-22 ENCOUNTER — Encounter (HOSPITAL_COMMUNITY): Payer: Self-pay | Admitting: Emergency Medicine

## 2018-01-22 ENCOUNTER — Emergency Department (HOSPITAL_COMMUNITY)
Admission: EM | Admit: 2018-01-22 | Discharge: 2018-01-22 | Disposition: A | Discharge status: Home / Self Care | Payer: 59 | Attending: Emergency Medicine | Admitting: Emergency Medicine

## 2018-01-22 ENCOUNTER — Other Ambulatory Visit: Payer: Self-pay

## 2018-01-22 DIAGNOSIS — S0031XA Abrasion of nose, initial encounter: Secondary | ICD-10-CM | POA: Insufficient documentation

## 2018-01-22 DIAGNOSIS — Y93E1 Activity, personal bathing and showering: Secondary | ICD-10-CM | POA: Diagnosis not present

## 2018-01-22 DIAGNOSIS — Z79899 Other long term (current) drug therapy: Secondary | ICD-10-CM | POA: Insufficient documentation

## 2018-01-22 DIAGNOSIS — R55 Syncope and collapse: Secondary | ICD-10-CM | POA: Insufficient documentation

## 2018-01-22 DIAGNOSIS — Y999 Unspecified external cause status: Secondary | ICD-10-CM | POA: Diagnosis not present

## 2018-01-22 DIAGNOSIS — I10 Essential (primary) hypertension: Secondary | ICD-10-CM | POA: Insufficient documentation

## 2018-01-22 DIAGNOSIS — Y92002 Bathroom of unspecified non-institutional (private) residence single-family (private) house as the place of occurrence of the external cause: Secondary | ICD-10-CM | POA: Diagnosis not present

## 2018-01-22 DIAGNOSIS — W19XXXA Unspecified fall, initial encounter: Secondary | ICD-10-CM | POA: Diagnosis not present

## 2018-01-22 DIAGNOSIS — S0992XA Unspecified injury of nose, initial encounter: Secondary | ICD-10-CM | POA: Diagnosis present

## 2018-01-22 LAB — CBC WITH DIFFERENTIAL/PLATELET
Abs Immature Granulocytes: 0.05 10*3/uL (ref 0.00–0.07)
Basophils Absolute: 0.1 10*3/uL (ref 0.0–0.1)
Basophils Relative: 0 %
Eosinophils Absolute: 0.1 10*3/uL (ref 0.0–0.5)
Eosinophils Relative: 1 %
HCT: 42.3 % (ref 36.0–46.0)
Hemoglobin: 13.3 g/dL (ref 12.0–15.0)
Immature Granulocytes: 0 %
Lymphocytes Relative: 19 %
Lymphs Abs: 2.4 10*3/uL (ref 0.7–4.0)
MCH: 27.6 pg (ref 26.0–34.0)
MCHC: 31.4 g/dL (ref 30.0–36.0)
MCV: 87.8 fL (ref 80.0–100.0)
Monocytes Absolute: 0.7 10*3/uL (ref 0.1–1.0)
Monocytes Relative: 6 %
Neutro Abs: 9.6 10*3/uL — ABNORMAL HIGH (ref 1.7–7.7)
Neutrophils Relative %: 74 %
Platelets: 309 10*3/uL (ref 150–400)
RBC: 4.82 MIL/uL (ref 3.87–5.11)
RDW: 13.3 % (ref 11.5–15.5)
WBC: 12.9 10*3/uL — ABNORMAL HIGH (ref 4.0–10.5)
nRBC: 0 % (ref 0.0–0.2)

## 2018-01-22 LAB — BASIC METABOLIC PANEL
Anion gap: 11 (ref 5–15)
BUN: 13 mg/dL (ref 6–20)
CO2: 26 mmol/L (ref 22–32)
Calcium: 9.2 mg/dL (ref 8.9–10.3)
Chloride: 101 mmol/L (ref 98–111)
Creatinine, Ser: 0.73 mg/dL (ref 0.44–1.00)
GFR calc Af Amer: >60 mL/min (ref >60)
GFR calc non Af Amer: >60 mL/min (ref >60)
Glucose, Bld: 100 mg/dL — ABNORMAL HIGH (ref 70–99)
Potassium: 3.6 mmol/L (ref 3.5–5.1)
Sodium: 138 mmol/L (ref 135–145)

## 2018-01-22 NOTE — ED Triage Notes (Signed)
Pt reports feeling hot, dizzy, lightheaded while in the shower this morning. Got out of the shower, blacked out, faceforward onto hardwood floor. Reports hit forehead, abrasion noted to nose. Pt awake, alert, appropriate at present. VSS.

## 2018-02-03 NOTE — ED Provider Notes (Signed)
[facility] Provider Note   CSN: [id number] Arrival date & time: 01/22/18  0940     History   Chief Complaint Chief Complaint  Patient presents with  . Loss of Consciousness    HPI Tara Bush is a 27 y.o. female.  HPI   27 year old female with syncope.  She is any other child this morning when she began to feel hot all over lightheaded and then passed out.  She fell forward and struck her head.  She is not sure how long she was unconscious.  She had a mild abrasion to her nose.  No significant headache.  Denies any acute neck or back pain.  She is at her baseline per family at bedside.  She has no acute neurological complaints.  She is not anticoagulated.  Past Medical History:  Diagnosis Date  . Dysfunctional uterine bleeding   . Hypertension    started lisinopril 1/16  . Obesity   . Post traumatic stress disorder (PTSD)     Patient Active Problem List   Diagnosis Date Noted  . Encounter for insertion of mirena IUD 10/18/2015  . BMI 60.0-69.9, adult (HCC) 03/30/2015  . PCOS (polycystic ovarian syndrome) 03/30/2015  . DUB (dysfunctional uterine bleeding) 06/07/2014  . Hypertension   . Morbid obesity (HCC) 05/20/2013    Past Surgical History:  Procedure Laterality Date  . TONSILLECTOMY AND ADENOIDECTOMY       OB History    Gravida  0   Para  0   Term  0   Preterm  0   AB  0   Living  0     SAB  0   TAB  0   Ectopic  0   Multiple  0   Live Births  0            Home Medications    Prior to Admission medications   Medication Sig Start Date End Date Taking? Authorizing Provider  aspirin-acetaminophen-caffeine (EXCEDRIN MIGRAINE) [phone] [id number] MG tablet Take 1 tablet by mouth every 6 (six) hours as needed for headache.   Yes [provider]  citalopram (CELEXA) 40 MG tablet TAKE 1 TABLET (40 MG TOTAL) BY MOUTH DAILY. 09/20/16  Yes Johna SheriffVincent, Carol L, MD  hydrochlorothiazide (HYDRODIURIL) 25  MG tablet TAKE 1 TABLET (25 MG TOTAL) BY MOUTH DAILY. 06/19/16  Yes Johna SheriffVincent, Carol L, MD  ibuprofen (ADVIL,MOTRIN) 200 MG tablet Take 400 mg by mouth every 6 (six) hours as needed for moderate pain.   Yes [provider]  Melatonin 5 MG TABS Take 15-25 mg by mouth at bedtime.   Yes [provider]    Family History Family History  Problem Relation Age of Onset  . Diabetes Mother   . Multiple sclerosis Mother   . Stroke Mother   . Hypertension Mother   . Hypertension Father   . Cancer Maternal Grandmother        ovarian/ uterine   . Diabetes Maternal Grandmother   . Stroke Maternal Grandfather   . Hypertension Maternal Grandfather   . Aneurysm Maternal Grandfather     Social History Social History   Tobacco Use  . Smoking status: Never Smoker  . Smokeless tobacco: Never Used  Substance Use Topics  . Alcohol use: Yes    Comment: occ  . Drug use: No     Allergies   Patient has no known allergies.   Review of Systems Review of Systems  All systems reviewed and  negative, other than as noted in HPI.'  Physical Exam Updated Vital Signs BP (!) 144/87 (BP Location: Right Arm)   Pulse 77   Temp 98.1 F (36.7 C) (Oral)   Resp 14   Ht 5\' 6"  (1.676 m)   Wt (!) 150.1 kg   SpO2 100%   BMI 53.42 kg/m   Physical Exam Vitals signs and nursing note reviewed.  Constitutional:      General: She is not in acute distress.    Appearance: She is well-developed.  HENT:     Head: Normocephalic.     Comments: Superficial abrasion to nose.  Significant swelling.  No midline spinal tenderness. Eyes:     General:        Right eye: No discharge.        Left eye: No discharge.     Conjunctiva/sclera: Conjunctivae normal.  Neck:     Musculoskeletal: Neck supple.  Cardiovascular:     Rate and Rhythm: Normal rate and regular rhythm.     Heart sounds: Normal heart sounds. No murmur. No friction rub. No gallop.   Pulmonary:     Effort: Pulmonary effort is normal.  No respiratory distress.     Breath sounds: Normal breath sounds.  Abdominal:     General: There is no distension.     Palpations: Abdomen is soft.     Tenderness: There is no abdominal tenderness.  Musculoskeletal:        General: No tenderness.  Skin:    General: Skin is warm and dry.  Neurological:     General: No focal deficit present.     Mental Status: She is alert and oriented to person, place, and time.     Cranial Nerves: No cranial nerve deficit.     Sensory: No sensory deficit.     Motor: No weakness.     Coordination: Coordination normal.     Gait: Gait normal.  Psychiatric:        Behavior: Behavior normal.        Thought Content: Thought content normal.      ED Treatments / Results  Labs (all labs ordered are listed, but only abnormal results are displayed) Labs Reviewed  CBC WITH DIFFERENTIAL/PLATELET - Abnormal; Notable for the following components:      Result Value   WBC 12.9 (*)    Neutro Abs 9.6 (*)    All other components within normal limits  BASIC METABOLIC PANEL - Abnormal; Notable for the following components:   Glucose, Bld 100 (*)    All other components within normal limits    EKG EKG Interpretation  Date/Time:  Thursday January 22 2018 10:00:18 EST Ventricular Rate:  75 PR Interval:    QRS Duration: 93 QT Interval:  408 QTC Calculation: 456 R Axis:   56 Text Interpretation:  Sinus rhythm Borderline prolonged PR interval Baseline wander in lead(s) II III aVF No old tracing to compare Reconfirmed by Raeford Razor [id number]) on 01/22/2018 10:43:42 AM   Radiology No results found.  Procedures Procedures (including critical care time)  Medications Ordered in ED Medications - No data to display   Initial Impression / Assessment and Plan / ED Course  I have reviewed the triage vital signs and the nursing notes.  Pertinent labs & imaging results that were available during my care of the patient were reviewed by me and considered in  my medical decision making (see chart for details).       27y  female  with syncope.  Now essentially back to her baseline.  Vasodilation related to hot shower may have contributed?  Regardless, I doubt emergent process.  ED work-up fairly unremarkable.  She is dynamically stable.  She did strike her head, but does not have much in terms of a headache and she has a nonfocal neurologic examination.  I have a very low suspicion for skull fracture or head bleed or other emergent process.  Strain plenty of fluids.  Return precautions were discussed.  Final Clinical Impressions(s) / ED Diagnoses   Final diagnoses:  Syncope, unspecified syncope type    ED Discharge Orders    None       Raeford RazorKohut, Darnesha Diloreto, MD 02/03/18 [phone]

## 2018-02-20 ENCOUNTER — Other Ambulatory Visit: Payer: 59 | Admitting: Women's Health

## 2018-11-02 ENCOUNTER — Other Ambulatory Visit: Payer: Self-pay | Admitting: Surgery

## 2018-11-06 ENCOUNTER — Other Ambulatory Visit: Payer: Self-pay

## 2018-11-06 ENCOUNTER — Ambulatory Visit (HOSPITAL_COMMUNITY)
Admission: RE | Admit: 2018-11-06 | Discharge: 2018-11-06 | Disposition: A | Discharge status: Home / Self Care | Payer: BC Managed Care – PPO | Source: Ambulatory Visit | Attending: Surgery | Admitting: Surgery

## 2018-11-06 DIAGNOSIS — Z01818 Encounter for other preprocedural examination: Secondary | ICD-10-CM | POA: Diagnosis not present

## 2018-11-06 DIAGNOSIS — I10 Essential (primary) hypertension: Secondary | ICD-10-CM | POA: Diagnosis not present

## 2018-11-20 ENCOUNTER — Encounter: Payer: BC Managed Care – PPO | Attending: Surgery | Admitting: Dietician

## 2018-11-20 ENCOUNTER — Encounter: Payer: Self-pay | Admitting: Dietician

## 2018-11-20 ENCOUNTER — Other Ambulatory Visit: Payer: Self-pay

## 2018-11-20 DIAGNOSIS — E669 Obesity, unspecified: Secondary | ICD-10-CM | POA: Diagnosis not present

## 2018-11-20 NOTE — Progress Notes (Signed)
Nutrition Assessment for Bariatric Surgery Medical Nutrition Therapy  Appt Start Time: 8:00am    End Time: 9:15am  Patient was seen on 11/20/2018 for Pre-Operative Nutrition Assessment. Letter of approval faxed to [facility] bariatric surgery program coordinator on 11/20/2018.   Referral stated Supervised Weight Loss (SWL) visits needed: 0  Planned surgery: RYGB Pt expectation of surgery: to lose weight, be healthier, be able to have children, get back into playing sports again  Pt expectation of dietitian: provide dietary guidance for how to eat healthier and lose weight   NUTRITION ASSESSMENT   Anthropometrics  Start weight at [facility]: 414.8 lbs (date: 11/20/2018) Height: 65 in BMI: 69 kg/m2    Clinical  Medical hx: obesity, HTN, anxiety Medications: citalopram, hydrochlorothiazide    Lifestyle & Dietary Hx Patient is very kind and personable. 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 Seems to have a supportive family and has done a lot of research on bariatric surgery, states she wants to do everything right and be successful. Interested in learning more about nutrition for health and weight loss.   Typical meal pattern is at least 2 meals per day with snacks in between. States she always eats breakfast before her shift, will at least snack (while driving) then eat dinner. States she has a mini fridge and is able to cook food on the road. Likes meal prepping when possible, otherwise will have fast food on the road such as Subway.   Likes being physically active and would love to get back into sports, such as an adult softball league. 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   24-Hr Dietary Recall First Meal: eggs + bacon/turkey bacon (or biscuit + hashbrowns)  Snack: Goldfish (or pretzels)  Second Meal: beef jerky (or cheese  sticks + grapes)   Snack: P[place]arts  Third Meal: chicken chili (or buffalo chicken dip, or Subway)  Snack: none  Beverages: water, soda, chocolate milk   Estimated Energy Needs Calories: 1800 Carbohydrate: 200g Protein: 113g Fat: 60g   NUTRITION DIAGNOSIS  Overweight/obesity ([place]-3.3) related to past poor dietary habits and physical inactivity as evidenced by patient w/ planned RYGB surgery following dietary guidelines for continued weight loss.    NUTRITION INTERVENTION  Nutrition counseling (C-1) and education (E-2) to facilitate bariatric surgery goals.  Pre-Op Goals Reviewed with the Patient . Track food and beverage intake (pen and paper, MyFitness Pal, Baritastic app, etc.) . Make healthy food choices while monitoring portion sizes . Consume 3 meals per day or try to eat every 3-5 hours . Avoid concentrated sugars and fried foods . Keep sugar & fat in the single digits per serving on food labels . Practice CHEWING your food (aim for applesauce consistency) . Practice not drinking 15 minutes before, during, and 30 minutes after each meal and snack . Avoid all carbonated beverages (ex: soda, sparkling beverages)  . Limit caffeinated beverages (ex: coffee, tea, energy drinks) . Avoid all sugar-sweetened beverages (ex: regular soda, sports drinks)  . Avoid alcohol  . Aim for 64-100 ounces of FLUID daily (with at least half of fluid intake being plain water)  . Aim for at least 60-80 grams of PROTEIN daily . Look for a liquid protein source that contains ?15 g protein and ?5 g carbohydrate (ex: shakes, drinks, shots) . Make a list of non-food  related activities . Physical activity is an important part of a healthy lifestyle so keep it moving! The goal is to reach 150 minutes of exercise per week, including cardiovascular and weight baring activity.  Handouts Provided Include  . Bariatric Surgery handouts (Nutrition Visits, Pre-Op Goals, Protein Shakes, Vitamins &  Minerals) . MyPlate & Meal Ideas  Learning Style & Readiness for Change Teaching method utilized: Visual & Auditory  Demonstrated degree of understanding via: Teach Back  Barriers to learning/adherence to lifestyle change: None Identified    MONITORING & EVALUATION Dietary intake, weekly physical activity, body weight, and pre-op goals reached at next nutrition visit.   Next Steps Patient is to call [facility] to enroll in Pre-Op Class (>2 weeks before surgery) and Post-Op Class (2 weeks after surgery) for further nutrition education when surgery date is scheduled.

## 2018-12-28 ENCOUNTER — Encounter: Payer: BC Managed Care – PPO | Attending: Surgery | Admitting: Skilled Nursing Facility1

## 2018-12-28 ENCOUNTER — Other Ambulatory Visit: Payer: Self-pay

## 2018-12-28 DIAGNOSIS — E669 Obesity, unspecified: Secondary | ICD-10-CM | POA: Insufficient documentation

## 2018-12-28 NOTE — Progress Notes (Signed)
Pre-Operative Nutrition Class:  Appt start time: 3838   End time:  1830.  Patient was seen on 12/28/2018 for Pre-Operative Bariatric Surgery Education at [facility].   Surgery date:  Surgery type: RYGB Start weight at [facility]: 414.8 Weight today: 425  Samples given per MNT protocol. Patient educated on appropriate usage: Celebrate calcium Lot: 1840 [id number] 11/21  The following the learning objectives were met by the patient during this course:  Identify Pre-Op Dietary Goals and will begin 2 weeks pre-operatively  Identify appropriate sources of fluids and proteins   State protein recommendations and appropriate sources pre and post-operatively  Identify Post-Operative Dietary Goals and will follow for 2 weeks post-operatively  Identify appropriate multivitamin and calcium sources  Describe the need for physical activity post-operatively and will follow MD recommendations  State when to call healthcare provider regarding medication questions or post-operative complications  Handouts given during class include:  Pre-Op Bariatric Surgery Diet Handout  Protein Shake Handout  Post-Op Bariatric Surgery Nutrition Handout  BELT Program Information Flyer  Support Group Information Flyer  [facility] Bariatric Supplements Price List  Follow-Up Plan: Patient will follow-up at [facility] 2 weeks post operatively for diet advancement per MD.

## 2019-01-19 NOTE — Patient Instructions (Addendum)
DUE TO COVID-19 ONLY ONE VISITOR IS ALLOWED TO COME WITH YOU AND STAY IN THE WAITING ROOM ONLY DURING PRE OP AND PROCEDURE DAY OF SURGERY. THE 1 VISITOR MAY VISIT WITH YOU AFTER SURGERY IN YOUR PRIVATE ROOM DURING VISITING HOURS ONLY!  YOU NEED TO HAVE A COVID 19 TEST ON____12/10/2020___ @____215  PM___, THIS TEST MUST BE DONE BEFORE SURGERY, COME  [address], [place] , [phone].  [facility] ONCE YOUR COVID TEST IS COMPLETED, PLEASE BEGIN THE QUARANTINE INSTRUCTIONS AS OUTLINED IN YOUR HANDOUT.                Tara Bush    Your procedure is scheduled on: 01/25/2019   [facility]   Report to admitting at 0830 AM     Call this number if you have problems the morning of surgery [phone]    Remember: Do not eat food or drink liquids :After Midnight. BRUSH YOUR TEETH MORNING OF SURGERY AND RINSE YOUR MOUTH OUT, NO CHEWING GUM CANDY OR MINTS.     Take these medicines the morning of surgery with A SIP OF WATER: Celexa                                 You may not have any metal on your body including hair pins and              piercings  Do not wear jewelry, make-up, lotions, powders or perfumes, deodorant             Do not wear nail polish on your fingernails.  Do not shave  48 hours prior to surgery.        Do not bring valuables to the hospital. [facility] IS NOT             RESPONSIBLE   FOR VALUABLES.  Contacts, dentures or bridgework may not be worn into surgery.  Leave suitcase in the car. After surgery it may be brought to your room.     MORNING OF SURGERY DRINK:   DRINK 1 G2 drink BEFORE YOU LEAVE HOME, DRINK ALL OF THE  G2 DRINK AT ONE TIME.    NO SOLID FOOD AFTER 600 PM THE NIGHT BEFORE YOUR SURGERY. YOU MAY DRINK CLEAR FLUIDS. THE G2 DRINK YOU DRINK BEFORE YOU LEAVE HOME WILL BE THE LAST FLUIDS YOU DRINK BEFORE SURGERY.  PAIN IS EXPECTED AFTER SURGERY AND WILL NOT BE COMPLETELY ELIMINATED. AMBULATION  AND TYLENOL WILL HELP REDUCE INCISIONAL AND GAS PAIN. MOVEMENT IS KEY!  YOU ARE EXPECTED TO BE OUT OF BED WITHIN 4 HOURS OF ADMISSION TO YOUR PATIENT ROOM.  SITTING IN THE RECLINER THROUGHOUT THE DAY IS IMPORTANT FOR DRINKING FLUIDS AND MOVING GAS THROUGHOUT THE GI TRACT.  [facility]PRESS[facility] UNLESS YOU ARE WALKING.   INCENTIVE SPIROMETER SHOULD BE USED EVERY HOUR WHILE AWAKE TO DECREASE POST-OPERATIVE COMPLICATIONS SUCH AS PNEUMONIA.  WHEN DISCHARGED HOME, IT IS IMPORTANT TO CONTINUE TO WALK EVERY HOUR AND USE THE INCENTIVE SPIROMETER EVERY HOUR.      CLEAR LIQUID DIET   Foods Allowed  Foods Excluded  Coffee and tea, regular and decaf                             liquids that you cannot  Plain Jell-O any favor except red or purple SUGAR FREE ONLY                                          see through such as: Fruit ices (not with fruit pulp)    SUGAR FREE ONLY              milk, soups, orange juice  Iced Popsicles SUGAR FREE ONLY                                     All solid food Lightly seasoned clear broth or consume(fat free)   Sample Menu Breakfast                                Lunch                                     Supper  Beef broth                            Chicken broth Jell-O  SUGAR FREE ONLY                                                  Coffee or tea         Jell-O  SUGAR FREE ONLY                                   Popsicle SUGAR FREE ONLY                                                Coffee or tea                        Coffee or tea  _____________________________________________________________________  [facility] - Preparing for Surgery Before surgery, you can play an important role.  Because skin is not sterile, your skin needs to be as free of germs as possible.  You can reduce the number of germs on your skin by washing with CHG (chlorahexidine  gluconate) soap before surgery.  CHG is an antiseptic cleaner which kills germs and bonds with the skin to continue killing germs even after washing. Please DO NOT use if you have an allergy to CHG or antibacterial soaps.  If your skin becomes reddened/irritated stop using the CHG and inform your nurse when you arrive at Short Stay. Do not shave (including legs and underarms) for at least 48 hours prior to the first CHG shower.  You may  shave your face/neck.  Please follow these instructions carefully:  1.  Shower with CHG Soap the night before surgery and the  morning of surgery.  2.  If you choose to wash your hair, wash your hair first as usual with your normal  shampoo.  3.  After you shampoo, rinse your hair and body thoroughly to remove the shampoo.                             4.  Use CHG as you would any other liquid soap.  You can apply chg directly to the skin and wash.  Gently with a scrungie or clean washcloth.  5.  Apply the CHG Soap to your body ONLY FROM THE NECK DOWN.   Do   not use on face/ open                           Wound or open sores. Avoid contact with eyes, ears mouth and   genitals (private parts).                       Wash face,  Genitals (private parts) with your normal soap.             6.  Wash thoroughly, paying special attention to the area where your    surgery  will be performed.  7.  Thoroughly rinse your body with warm water from the neck down.  8.  DO NOT shower/wash with your normal soap after using and rinsing off the CHG Soap.                9.  Pat yourself dry with a clean towel.            10.  Wear clean pajamas.            11.  Place clean sheets on your bed the night of your first shower and do not  sleep with pets. Day of Surgery : Do not apply any lotions/deodorants the morning of surgery.  Please wear clean clothes to the hospital/surgery center.  FAILURE TO FOLLOW THESE INSTRUCTIONS MAY RESULT IN THE CANCELLATION OF YOUR SURGERY  PATIENT  SIGNATURE_________________________________  NURSE SIGNATURE__________________________________  ________________________________________________________________________    Tara Bush  An incentive spirometer is a tool that can help keep your lungs clear and active. This tool measures how well you are filling your lungs with each breath. Taking long deep breaths may help reverse or decrease the chance of developing breathing (pulmonary) problems (especially infection) following:  A long period of time when you are unable to move or be active. BEFORE THE PROCEDURE   If the spirometer includes an indicator to show your best effort, your nurse or respiratory therapist will set it to a desired goal.  If possible, sit up straight or lean slightly forward. Try not to slouch.  Hold the incentive spirometer in an upright position. INSTRUCTIONS FOR USE  1. Sit on the edge of your bed if possible, or sit up as far as you can in bed or on a chair. 2. Hold the incentive spirometer in an upright position. 3. Breathe out normally. 4. Place the mouthpiece in your mouth and seal your lips tightly around it. 5. Breathe in slowly and as deeply as possible, raising the piston or the ball toward the top of the column. 6. Hold your breath  for 3-5 seconds or for as long as possible. Allow the piston or ball to fall to the bottom of the column. 7. Remove the mouthpiece from your mouth and breathe out normally. 8. Rest for a few seconds and repeat Steps 1 through 7 at least 10 times every 1-2 hours when you are awake. Take your time and take a few normal breaths between deep breaths. 9. The spirometer may include an indicator to show your best effort. Use the indicator as a goal to work toward during each repetition. 10. After each set of 10 deep breaths, practice coughing to be sure your lungs are clear. If you have an incision (the cut made at the time of surgery), support your incision when coughing  by placing a pillow or rolled up towels firmly against it. Once you are able to get out of bed, walk around indoors and cough well. You may stop using the incentive spirometer when instructed by your caregiver.  RISKS AND COMPLICATIONS  Take your time so you do not get dizzy or light-headed.  If you are in pain, you may need to take or ask for pain medication before doing incentive spirometry. It is harder to take a deep breath if you are having pain. AFTER USE  Rest and breathe slowly and easily.  It can be helpful to keep track of a log of your progress. Your caregiver can provide you with a simple table to help with this. If you are using the spirometer at home, follow these instructions: [facility] IF:   You are having difficultly using the spirometer.  You have trouble using the spirometer as often as instructed.  Your pain medication is not giving enough relief while using the spirometer.  You develop fever of 100.5 F (38.1 C) or higher. SEEK IMMEDIATE MEDICAL CARE IF:   You cough up bloody sputum that had not been present before.  You develop fever of 102 F (38.9 C) or greater.  You develop worsening pain at or near the incision site. MAKE SURE YOU:   Understand these instructions.  Will watch your condition.  Will get help right away if you are not doing well or get worse. Document Released: 06/10/2006 Document Revised: 04/22/2011 Document Reviewed: 08/11/2006 [facility] Patient Information 2014 [place], [place].   ________________________________________________________________________

## 2019-01-21 ENCOUNTER — Other Ambulatory Visit (HOSPITAL_COMMUNITY)
Admission: RE | Admit: 2019-01-21 | Discharge: 2019-01-21 | Disposition: A | Discharge status: Home / Self Care | Payer: BC Managed Care – PPO | Source: Ambulatory Visit | Attending: Surgery | Admitting: Surgery

## 2019-01-21 ENCOUNTER — Encounter (HOSPITAL_COMMUNITY)
Admission: RE | Admit: 2019-01-21 | Discharge: 2019-01-21 | Disposition: A | Discharge status: Home / Self Care | Payer: BC Managed Care – PPO | Source: Ambulatory Visit | Attending: Surgery | Admitting: Surgery

## 2019-01-21 ENCOUNTER — Encounter (HOSPITAL_COMMUNITY): Payer: Self-pay

## 2019-01-21 ENCOUNTER — Ambulatory Visit: Payer: Self-pay | Admitting: Surgery

## 2019-01-21 ENCOUNTER — Other Ambulatory Visit: Payer: Self-pay

## 2019-01-21 DIAGNOSIS — Z20828 Contact with and (suspected) exposure to other viral communicable diseases: Secondary | ICD-10-CM | POA: Insufficient documentation

## 2019-01-21 DIAGNOSIS — Z01812 Encounter for preprocedural laboratory examination: Secondary | ICD-10-CM | POA: Insufficient documentation

## 2019-01-21 DIAGNOSIS — E669 Obesity, unspecified: Secondary | ICD-10-CM | POA: Diagnosis not present

## 2019-01-21 LAB — CBC WITH DIFFERENTIAL/PLATELET
Abs Immature Granulocytes: 0.05 10*3/uL (ref 0.00–0.07)
Basophils Absolute: 0.1 10*3/uL (ref 0.0–0.1)
Basophils Relative: 1 %
Eosinophils Absolute: 0.1 10*3/uL (ref 0.0–0.5)
Eosinophils Relative: 1 %
HCT: 44.5 % (ref 36.0–46.0)
Hemoglobin: 14.3 g/dL (ref 12.0–15.0)
Immature Granulocytes: 0 %
Lymphocytes Relative: 18 %
Lymphs Abs: 2.5 10*3/uL (ref 0.7–4.0)
MCH: 28.7 pg (ref 26.0–34.0)
MCHC: 32.1 g/dL (ref 30.0–36.0)
MCV: 89.2 fL (ref 80.0–100.0)
Monocytes Absolute: 1 10*3/uL (ref 0.1–1.0)
Monocytes Relative: 7 %
Neutro Abs: 10.2 10*3/uL — ABNORMAL HIGH (ref 1.7–7.7)
Neutrophils Relative %: 73 %
Platelets: 329 10*3/uL (ref 150–400)
RBC: 4.99 MIL/uL (ref 3.87–5.11)
RDW: 12.8 % (ref 11.5–15.5)
WBC: 13.9 10*3/uL — ABNORMAL HIGH (ref 4.0–10.5)
nRBC: 0 % (ref 0.0–0.2)

## 2019-01-21 LAB — COMPREHENSIVE METABOLIC PANEL
ALT: 49 U/L — ABNORMAL HIGH (ref 0–44)
AST: 33 U/L (ref 15–41)
Albumin: 4.3 g/dL (ref 3.5–5.0)
Alkaline Phosphatase: 78 U/L (ref 38–126)
Anion gap: 11 (ref 5–15)
BUN: 21 mg/dL — ABNORMAL HIGH (ref 6–20)
CO2: 27 mmol/L (ref 22–32)
Calcium: 9.6 mg/dL (ref 8.9–10.3)
Chloride: 99 mmol/L (ref 98–111)
Creatinine, Ser: 0.74 mg/dL (ref 0.44–1.00)
GFR calc Af Amer: >60 mL/min (ref >60)
GFR calc non Af Amer: >60 mL/min (ref >60)
Glucose, Bld: 87 mg/dL (ref 70–99)
Potassium: 4.2 mmol/L (ref 3.5–5.1)
Sodium: 137 mmol/L (ref 135–145)
Total Bilirubin: 1.1 mg/dL (ref 0.3–1.2)
Total Protein: 8.4 g/dL — ABNORMAL HIGH (ref 6.5–8.1)

## 2019-01-21 MED ORDER — CHLORHEXIDINE GLUCONATE CLOTH 2 % EX PADS
6.0000 | MEDICATED_PAD | Freq: Once | CUTANEOUS | Status: DC
  Filled 2019-01-21: qty 6

## 2019-01-21 NOTE — Progress Notes (Addendum)
PCP - Dianna Rossetti, NP Cardiologist - n/a  Chest x-ray -12/2018 EKG - 12/2018 Stress Test -  ECHO -  Cardiac Cath -   Sleep Study -n/a  CPAP -   Fasting Blood Sugar - n/a Checks Blood Sugar _____ times a day  Blood Thinner Instructions:NONE Aspirin Instructions: Last Dose:  Anesthesia review: No review needed.  Patient denies shortness of breath, fever, cough and chest pain at PAT appointment   Patient verbalized understanding of instructions that were given to them at the PAT appointment. Patient was also instructed that they will need to review over the PAT instructions again at home before surgery.Marland Kitchen

## 2019-01-22 ENCOUNTER — Telehealth: Payer: Self-pay | Admitting: Dietician

## 2019-01-22 LAB — NOVEL CORONAVIRUS, NAA (HOSP ORDER, SEND-OUT TO REF LAB; TAT 18-24 HRS): SARS-CoV-2, NAA: NOT DETECTED

## 2019-01-22 LAB — ABO/RH: ABO/RH(D): A NEG

## 2019-01-22 NOTE — Telephone Encounter (Signed)
Patient called with questions regarding the Pre-Op Diet. We discussed and I answered her questions.   Surgery Date: 01/25/2019 Surgery Type: RYGB  Plan to follow up with patient at Post-Op Class 2 weeks after surgery.    Nat Christen [place]) Cardarius Senat, MS, RD, LDN

## 2019-01-22 NOTE — Progress Notes (Signed)
Spoke to pt to advised that surgery time on 01/25/19 is now 7:30 AM. Pt to report to Short Stay at 5:30 AM.

## 2019-01-24 MED ORDER — BUPIVACAINE LIPOSOME 1.3 % IJ SUSP
20.0000 mL | Freq: Once | INTRAMUSCULAR | Status: DC
  Filled 2019-01-24: qty 20

## 2019-01-24 NOTE — H&P (Signed)
Tara Bush Documented: 12/25/2018 2:31 PM Location: [facility] Patient #: [id number] DOB: 07-08-1990 Married / Language: English / Race: White Female   History of Present Illness Tara Key B. Hassell Done MD; 12/25/2018 2:55 PM) The patient is a 28 year old female who presents for a bariatric surgery evaluation. Tara Bush comes in today to discuss further the roux en Y gastric bypass. Her weight today is 413 and Dr. Ardath Bush has approved her for surgery (he is supposed to emal that today). 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 She has not had any prior abdominal surgery. She is ready for a date for surgery in December.    Allergies (Tara Bush, [place]; 12/25/2018 2:32 PM) No Known Drug Allergies [10/28/2018]: Allergies Reconciled   Medication History (Tara Bush, [place]; 12/25/2018 2:32 PM) Citalopram Hydrobromide (40MG  Tablet, Oral) Active. hydroCHLOROthiazide (25MG  Tablet, Oral) Active. Medications Reconciled  Vitals (Tara Bush RMA; 12/25/2018 2:31 PM) 12/25/2018 2:31 PM Weight: 413.2 lb Height: 65.75in Body Surface Area: 2.72 m Body Mass Index: 67.2 kg/m  Temp.: 97.76F  Pulse: 104 (Regular)  BP: 134/86 (Sitting, Left Arm, Standard)       Physical Exam (Tara Bush B. Hassell Done MD; 12/25/2018 2:56 PM) General Note: Large WF NAD HEENT unremarkable Neck supple Chest clear Heart SR without murmurs Abdomen is obese without scars Ext FROM     Assessment & Plan Tara Key B. Hassell Done MD; 12/25/2018 2:57 PM) MORBID OBESITY, UNSPECIFIED OBESITY TYPE (E66.01) Story: Two weeks prior to surgery Go on the extremely low carb liquid diet One week prior to surgery No aspirin products. Tylenol is acceptable Stop smoking 24 hours prior to surgery No alcoholic beverages Report fever greater than 100.5 or excessive nasal drainage suggesting infection Continue bariatric preop diet Perform bowel prep if  ordered Do not eat or drink anything after midnight the night before surgery Do not take any medications except those instructed by the anesthesiologist Morning of surgery Please arrive at the hospital at least 2 hours before your scheduled surgery time. No makeup, fingernail polish or jewelry Bring insurance cards with you Bring your CPAP mask if you use this Impression: She is ready for lap roux en Y gastric bypass surgery.

## 2019-01-25 ENCOUNTER — Encounter (HOSPITAL_COMMUNITY): Payer: Self-pay | Admitting: Surgery

## 2019-01-25 ENCOUNTER — Inpatient Hospital Stay (HOSPITAL_COMMUNITY): Payer: BC Managed Care – PPO | Admitting: Physician Assistant

## 2019-01-25 ENCOUNTER — Inpatient Hospital Stay (HOSPITAL_COMMUNITY): Payer: BC Managed Care – PPO | Admitting: Certified Registered Nurse Anesthetist

## 2019-01-25 ENCOUNTER — Encounter (HOSPITAL_COMMUNITY)
Admission: AD | Disposition: A | Discharge status: Home / Self Care | Payer: Self-pay | Source: Home / Self Care | Attending: Surgery

## 2019-01-25 ENCOUNTER — Inpatient Hospital Stay (HOSPITAL_COMMUNITY)
Admission: AD | Admit: 2019-01-25 | Discharge: 2019-01-27 | DRG: 621 | Disposition: A | Discharge status: Home / Self Care | Payer: BC Managed Care – PPO | Attending: Surgery | Admitting: Surgery

## 2019-01-25 ENCOUNTER — Other Ambulatory Visit: Payer: Self-pay

## 2019-01-25 DIAGNOSIS — Z79899 Other long term (current) drug therapy: Secondary | ICD-10-CM | POA: Diagnosis not present

## 2019-01-25 DIAGNOSIS — Z9884 Bariatric surgery status: Secondary | ICD-10-CM

## 2019-01-25 DIAGNOSIS — F17200 Nicotine dependence, unspecified, uncomplicated: Secondary | ICD-10-CM | POA: Diagnosis not present

## 2019-01-25 DIAGNOSIS — Z6841 Body Mass Index (BMI) 40.0 and over, adult: Secondary | ICD-10-CM

## 2019-01-25 DIAGNOSIS — F419 Anxiety disorder, unspecified: Secondary | ICD-10-CM | POA: Diagnosis not present

## 2019-01-25 DIAGNOSIS — I10 Essential (primary) hypertension: Secondary | ICD-10-CM | POA: Diagnosis not present

## 2019-01-25 HISTORY — PX: GASTRIC ROUX-EN-Y: SHX5262

## 2019-01-25 LAB — CBC
HCT: 43.3 % (ref 36.0–46.0)
Hemoglobin: 13.9 g/dL (ref 12.0–15.0)
MCH: 28.7 pg (ref 26.0–34.0)
MCHC: 32.1 g/dL (ref 30.0–36.0)
MCV: 89.3 fL (ref 80.0–100.0)
Platelets: 323 10*3/uL (ref 150–400)
RBC: 4.85 MIL/uL (ref 3.87–5.11)
RDW: 12.6 % (ref 11.5–15.5)
WBC: 17.6 10*3/uL — ABNORMAL HIGH (ref 4.0–10.5)
nRBC: 0 % (ref 0.0–0.2)

## 2019-01-25 LAB — CREATININE, SERUM
Creatinine, Ser: 0.78 mg/dL (ref 0.44–1.00)
GFR calc Af Amer: >60 mL/min (ref >60)
GFR calc non Af Amer: >60 mL/min (ref >60)

## 2019-01-25 LAB — TYPE AND SCREEN
ABO/RH(D): A NEG
Antibody Screen: NEGATIVE

## 2019-01-25 LAB — PREGNANCY, URINE: Preg Test, Ur: NEGATIVE

## 2019-01-25 SURGERY — LAPAROSCOPIC ROUX-EN-Y GASTRIC BYPASS WITH UPPER ENDOSCOPY
Anesthesia: General

## 2019-01-25 MED ORDER — FENTANYL CITRATE (PF) 250 MCG/5ML IJ SOLN
INTRAMUSCULAR | Status: DC | PRN
  Administered 2019-01-25: 50 ug via INTRAVENOUS
  Administered 2019-01-25: 100 ug via INTRAVENOUS
  Administered 2019-01-25 (×4): 50 ug via INTRAVENOUS

## 2019-01-25 MED ORDER — ROCURONIUM BROMIDE 10 MG/ML (PF) SYRINGE
PREFILLED_SYRINGE | INTRAVENOUS | Status: AC
  Filled 2019-01-25: qty 10

## 2019-01-25 MED ORDER — SODIUM CHLORIDE (PF) 0.9 % IJ SOLN
INTRAMUSCULAR | Status: DC | PRN
  Administered 2019-01-25: 10 mL

## 2019-01-25 MED ORDER — PANTOPRAZOLE SODIUM 40 MG IV SOLR
40.0000 mg | Freq: Every day | INTRAVENOUS | Status: DC
  Administered 2019-01-25 – 2019-01-26 (×2): 40 mg via INTRAVENOUS
  Filled 2019-01-25 (×2): qty 40

## 2019-01-25 MED ORDER — SCOPOLAMINE 1 MG/3DAYS TD PT72
1.0000 | MEDICATED_PATCH | TRANSDERMAL | Status: DC
  Administered 2019-01-25: 1.5 mg via TRANSDERMAL
  Filled 2019-01-25: qty 1

## 2019-01-25 MED ORDER — FENTANYL CITRATE (PF) 100 MCG/2ML IJ SOLN
INTRAMUSCULAR | Status: AC
  Filled 2019-01-25: qty 2

## 2019-01-25 MED ORDER — FENTANYL CITRATE (PF) 250 MCG/5ML IJ SOLN
INTRAMUSCULAR | Status: AC
  Filled 2019-01-25: qty 5

## 2019-01-25 MED ORDER — OXYCODONE HCL 5 MG/5ML PO SOLN
5.0000 mg | Freq: Four times a day (QID) | ORAL | Status: DC | PRN
  Administered 2019-01-25 – 2019-01-26 (×2): 5 mg via ORAL
  Filled 2019-01-25 (×2): qty 5

## 2019-01-25 MED ORDER — HYDRALAZINE HCL 20 MG/ML IJ SOLN: 5.0000 mg | Freq: Four times a day (QID) | INTRAMUSCULAR | Status: DC | PRN

## 2019-01-25 MED ORDER — SUCCINYLCHOLINE CHLORIDE 200 MG/10ML IV SOSY
PREFILLED_SYRINGE | INTRAVENOUS | Status: DC | PRN
  Administered 2019-01-25: 140 mg via INTRAVENOUS

## 2019-01-25 MED ORDER — ESMOLOL HCL 100 MG/10ML IV SOLN
INTRAVENOUS | Status: AC
  Filled 2019-01-25: qty 10

## 2019-01-25 MED ORDER — LIDOCAINE 2% (20 MG/ML) 5 ML SYRINGE
INTRAMUSCULAR | Status: DC | PRN
  Administered 2019-01-25: 1.5 mg/kg/h via INTRAVENOUS

## 2019-01-25 MED ORDER — LIDOCAINE 2% (20 MG/ML) 5 ML SYRINGE
INTRAMUSCULAR | Status: AC
  Filled 2019-01-25: qty 5

## 2019-01-25 MED ORDER — ACETAMINOPHEN 500 MG PO TABS
1000.0000 mg | ORAL_TABLET | ORAL | Status: AC
  Administered 2019-01-25: 1000 mg via ORAL
  Filled 2019-01-25: qty 2

## 2019-01-25 MED ORDER — 0.9 % SODIUM CHLORIDE (POUR BTL) OPTIME
TOPICAL | Status: DC | PRN
  Administered 2019-01-25: 1000 mL

## 2019-01-25 MED ORDER — DEXAMETHASONE SODIUM PHOSPHATE 10 MG/ML IJ SOLN
INTRAMUSCULAR | Status: DC | PRN
  Administered 2019-01-25: 6 mg via INTRAVENOUS

## 2019-01-25 MED ORDER — KETAMINE HCL 10 MG/ML IJ SOLN
INTRAMUSCULAR | Status: AC
  Filled 2019-01-25: qty 1

## 2019-01-25 MED ORDER — KCL IN DEXTROSE-NACL 20-5-0.45 MEQ/L-%-% IV SOLN
INTRAVENOUS | Status: DC
  Administered 2019-01-25: 13:00:00 via INTRAVENOUS
  Filled 2019-01-25 (×4): qty 1000

## 2019-01-25 MED ORDER — ROCURONIUM BROMIDE 50 MG/5ML IV SOSY
PREFILLED_SYRINGE | INTRAVENOUS | Status: DC | PRN
  Administered 2019-01-25: 20 mg via INTRAVENOUS
  Administered 2019-01-25: 80 mg via INTRAVENOUS
  Administered 2019-01-25 (×3): 30 mg via INTRAVENOUS

## 2019-01-25 MED ORDER — LACTATED RINGERS IV SOLN
INTRAVENOUS | Status: DC
  Administered 2019-01-25 (×2): via INTRAVENOUS

## 2019-01-25 MED ORDER — ONDANSETRON HCL 4 MG/2ML IJ SOLN
INTRAMUSCULAR | Status: DC | PRN
  Administered 2019-01-25 (×2): 4 mg via INTRAVENOUS

## 2019-01-25 MED ORDER — LIP MEDEX EX OINT
TOPICAL_OINTMENT | CUTANEOUS | Status: AC
  Filled 2019-01-25: qty 7

## 2019-01-25 MED ORDER — KETAMINE HCL 10 MG/ML IJ SOLN
INTRAMUSCULAR | Status: DC | PRN
  Administered 2019-01-25: 40 mg via INTRAVENOUS

## 2019-01-25 MED ORDER — HEPARIN SODIUM (PORCINE) 5000 UNIT/ML IJ SOLN
5000.0000 [IU] | Freq: Three times a day (TID) | INTRAMUSCULAR | Status: DC
  Administered 2019-01-25 – 2019-01-27 (×5): 5000 [IU] via SUBCUTANEOUS
  Filled 2019-01-25 (×5): qty 1

## 2019-01-25 MED ORDER — TISSEEL VH 10 ML EX KIT
PACK | CUTANEOUS | Status: AC
  Filled 2019-01-25: qty 1

## 2019-01-25 MED ORDER — PROPOFOL 10 MG/ML IV BOLUS
INTRAVENOUS | Status: DC | PRN
  Administered 2019-01-25: 200 mg via INTRAVENOUS

## 2019-01-25 MED ORDER — HEPARIN SODIUM (PORCINE) 5000 UNIT/ML IJ SOLN
5000.0000 [IU] | INTRAMUSCULAR | Status: AC
  Administered 2019-01-25: 5000 [IU] via SUBCUTANEOUS
  Filled 2019-01-25: qty 1

## 2019-01-25 MED ORDER — BUPIVACAINE LIPOSOME 1.3 % IJ SUSP
INTRAMUSCULAR | Status: DC | PRN
  Administered 2019-01-25: 20 mL

## 2019-01-25 MED ORDER — APREPITANT 40 MG PO CAPS
40.0000 mg | ORAL_CAPSULE | ORAL | Status: AC
  Administered 2019-01-25: 40 mg via ORAL
  Filled 2019-01-25: qty 1

## 2019-01-25 MED ORDER — OXYCODONE HCL 5 MG PO TABS: 5.0000 mg | ORAL_TABLET | Freq: Once | ORAL | Status: DC | PRN

## 2019-01-25 MED ORDER — LABETALOL HCL 5 MG/ML IV SOLN
10.0000 mg | Freq: Once | INTRAVENOUS | Status: AC
  Administered 2019-01-25: 10 mg via INTRAVENOUS

## 2019-01-25 MED ORDER — LACTATED RINGERS IR SOLN
Status: DC | PRN
  Administered 2019-01-25: 1

## 2019-01-25 MED ORDER — OXYCODONE HCL 5 MG/5ML PO SOLN: 5.0000 mg | Freq: Once | ORAL | Status: DC | PRN

## 2019-01-25 MED ORDER — SODIUM CHLORIDE 0.9 % IV SOLN
2.0000 g | INTRAVENOUS | Status: AC
  Administered 2019-01-25: 08:00:00 2 g via INTRAVENOUS
  Filled 2019-01-25: qty 2

## 2019-01-25 MED ORDER — ONDANSETRON HCL 4 MG/2ML IJ SOLN: 4.0000 mg | Freq: Four times a day (QID) | INTRAMUSCULAR | Status: DC | PRN

## 2019-01-25 MED ORDER — INFLUENZA VAC SPLIT QUAD 0.5 ML IM SUSY: 0.5000 mL | PREFILLED_SYRINGE | INTRAMUSCULAR | Status: DC

## 2019-01-25 MED ORDER — ONDANSETRON HCL 4 MG/2ML IJ SOLN
INTRAMUSCULAR | Status: AC
  Filled 2019-01-25: qty 2

## 2019-01-25 MED ORDER — SODIUM CHLORIDE (PF) 0.9 % IJ SOLN
INTRAMUSCULAR | Status: AC
  Filled 2019-01-25: qty 20

## 2019-01-25 MED ORDER — MIDAZOLAM HCL 2 MG/2ML IJ SOLN
INTRAMUSCULAR | Status: AC
  Filled 2019-01-25: qty 2

## 2019-01-25 MED ORDER — SUGAMMADEX SODIUM 500 MG/5ML IV SOLN
INTRAVENOUS | Status: AC
  Filled 2019-01-25: qty 5

## 2019-01-25 MED ORDER — ONDANSETRON HCL 4 MG/2ML IJ SOLN
4.0000 mg | INTRAMUSCULAR | Status: DC | PRN
  Administered 2019-01-26 – 2019-01-27 (×3): 4 mg via INTRAVENOUS
  Filled 2019-01-25 (×3): qty 2

## 2019-01-25 MED ORDER — ENSURE MAX PROTEIN PO LIQD
2.0000 [oz_av] | ORAL | Status: DC
  Administered 2019-01-26 – 2019-01-27 (×6): 2 [oz_av] via ORAL

## 2019-01-25 MED ORDER — LACTATED RINGERS IV SOLN
INTRAVENOUS | Status: DC | PRN
  Administered 2019-01-25 (×3): via INTRAVENOUS

## 2019-01-25 MED ORDER — LABETALOL HCL 5 MG/ML IV SOLN
INTRAVENOUS | Status: AC
  Filled 2019-01-25: qty 4

## 2019-01-25 MED ORDER — SUCCINYLCHOLINE CHLORIDE 200 MG/10ML IV SOSY
PREFILLED_SYRINGE | INTRAVENOUS | Status: AC
  Filled 2019-01-25: qty 10

## 2019-01-25 MED ORDER — LIDOCAINE 2% (20 MG/ML) 5 ML SYRINGE
INTRAMUSCULAR | Status: DC | PRN
  Administered 2019-01-25: 100 mg via INTRAVENOUS

## 2019-01-25 MED ORDER — SUGAMMADEX SODIUM 200 MG/2ML IV SOLN
INTRAVENOUS | Status: DC | PRN
  Administered 2019-01-25: 400 mg via INTRAVENOUS

## 2019-01-25 MED ORDER — MIDAZOLAM HCL 5 MG/5ML IJ SOLN
INTRAMUSCULAR | Status: DC | PRN
  Administered 2019-01-25 (×2): 2 mg via INTRAVENOUS

## 2019-01-25 MED ORDER — ACETAMINOPHEN 500 MG PO TABS
1000.0000 mg | ORAL_TABLET | Freq: Three times a day (TID) | ORAL | Status: DC
  Administered 2019-01-26: 1000 mg via ORAL
  Filled 2019-01-25 (×2): qty 2

## 2019-01-25 MED ORDER — METOPROLOL TARTRATE 5 MG/5ML IV SOLN
5.0000 mg | Freq: Four times a day (QID) | INTRAVENOUS | Status: DC | PRN
  Administered 2019-01-25: 5 mg via INTRAVENOUS
  Filled 2019-01-25: qty 5

## 2019-01-25 MED ORDER — GABAPENTIN 300 MG PO CAPS
300.0000 mg | ORAL_CAPSULE | ORAL | Status: AC
  Administered 2019-01-25: 300 mg via ORAL
  Filled 2019-01-25: qty 1

## 2019-01-25 MED ORDER — LIDOCAINE HCL 2 % IJ SOLN
INTRAMUSCULAR | Status: AC
  Filled 2019-01-25: qty 40

## 2019-01-25 MED ORDER — TISSEEL VH 10 ML EX KIT
PACK | CUTANEOUS | Status: DC | PRN
  Administered 2019-01-25: 1

## 2019-01-25 MED ORDER — MORPHINE SULFATE (PF) 4 MG/ML IV SOLN
1.0000 mg | INTRAVENOUS | Status: DC | PRN
  Administered 2019-01-25 – 2019-01-26 (×4): 2 mg via INTRAVENOUS
  Filled 2019-01-25 (×4): qty 1

## 2019-01-25 MED ORDER — FENTANYL CITRATE (PF) 100 MCG/2ML IJ SOLN
25.0000 ug | INTRAMUSCULAR | Status: DC | PRN
  Administered 2019-01-25 (×2): 25 ug via INTRAVENOUS
  Administered 2019-01-25: 50 ug via INTRAVENOUS

## 2019-01-25 MED ORDER — DEXAMETHASONE SODIUM PHOSPHATE 10 MG/ML IJ SOLN
INTRAMUSCULAR | Status: AC
  Filled 2019-01-25: qty 1

## 2019-01-25 MED ORDER — ACETAMINOPHEN 160 MG/5ML PO SOLN
1000.0000 mg | Freq: Three times a day (TID) | ORAL | Status: DC
  Administered 2019-01-25 – 2019-01-26 (×3): 1000 mg via ORAL
  Filled 2019-01-25 (×3): qty 40.6

## 2019-01-25 MED ORDER — PROPOFOL 10 MG/ML IV BOLUS
INTRAVENOUS | Status: AC
  Filled 2019-01-25: qty 20

## 2019-01-25 SURGICAL SUPPLY — 69 items
APPLICATOR COTTON TIP 6 STRL (MISCELLANEOUS) ×2 IMPLANT
APPLICATOR COTTON TIP 6IN STRL (MISCELLANEOUS) ×4
APPLIER CLIP ROT 10 11.4 M/L (STAPLE)
APPLIER CLIP ROT 13.4 12 LRG (CLIP)
BENZOIN TINCTURE PRP APPL 2/3 (GAUZE/BANDAGES/DRESSINGS) IMPLANT
BLADE SURG 15 STRL LF DISP TIS (BLADE) ×1 IMPLANT
BLADE SURG 15 STRL SS (BLADE) ×1
CABLE HIGH FREQUENCY MONO STRZ (ELECTRODE) IMPLANT
CLIP APPLIE ROT 10 11.4 M/L (STAPLE) IMPLANT
CLIP APPLIE ROT 13.4 12 LRG (CLIP) IMPLANT
CLIP SUT LAPRA TY ABSORB (SUTURE) ×2 IMPLANT
COVER WAND RF STERILE (DRAPES) ×2 IMPLANT
DERMABOND ADVANCED (GAUZE/BANDAGES/DRESSINGS) ×1
DERMABOND ADVANCED .7 DNX12 (GAUZE/BANDAGES/DRESSINGS) ×1 IMPLANT
DEVICE SUT QUICK LOAD TK 5 (STAPLE) ×2 IMPLANT
DEVICE SUT TI-KNOT TK 5X26 (MISCELLANEOUS) ×2 IMPLANT
DEVICE SUTURE ENDOST 10MM (ENDOMECHANICALS) ×2 IMPLANT
DISSECTOR BLUNT TIP ENDO 5MM (MISCELLANEOUS) IMPLANT
DRAIN PENROSE 18X1/4 LTX STRL (WOUND CARE) ×2 IMPLANT
GAUZE 4X4 16PLY RFD (DISPOSABLE) ×2 IMPLANT
GAUZE SPONGE 4X4 12PLY STRL (GAUZE/BANDAGES/DRESSINGS) IMPLANT
GLOVE BIOGEL M 8.0 STRL (GLOVE) ×2 IMPLANT
GOWN STRL REUS W/TWL XL LVL3 (GOWN DISPOSABLE) ×8 IMPLANT
HANDLE STAPLE EGIA 4 XL (STAPLE) ×2 IMPLANT
HOVERMATT SINGLE USE (MISCELLANEOUS) ×2 IMPLANT
KIT BASIN OR (CUSTOM PROCEDURE TRAY) ×2 IMPLANT
KIT GASTRIC LAVAGE 34FR ADT (SET/KITS/TRAYS/PACK) ×2 IMPLANT
KIT TURNOVER KIT A (KITS) ×2 IMPLANT
MARKER SKIN DUAL TIP RULER LAB (MISCELLANEOUS) ×2 IMPLANT
NEEDLE SPNL 22GX3.5 QUINCKE BK (NEEDLE) ×2 IMPLANT
PACK CARDIOVASCULAR III (CUSTOM PROCEDURE TRAY) ×2 IMPLANT
PENCIL SMOKE EVACUATOR (MISCELLANEOUS) IMPLANT
RELOAD EGIA 45 MED/THCK PURPLE (STAPLE) IMPLANT
RELOAD EGIA 45 TAN VASC (STAPLE) IMPLANT
RELOAD EGIA 60 MED/THCK PURPLE (STAPLE) ×2 IMPLANT
RELOAD EGIA 60 TAN VASC (STAPLE) IMPLANT
RELOAD ENDO STITCH 2.0 (ENDOMECHANICALS) ×9
RELOAD TRI 45 ART MED THCK PUR (STAPLE) IMPLANT
RELOAD TRI 60 ART MED THCK PUR (STAPLE) ×4 IMPLANT
SCISSORS LAP 5X45 EPIX DISP (ENDOMECHANICALS) ×2 IMPLANT
SEALANT SURGICAL APPL DUAL CAN (MISCELLANEOUS) ×2 IMPLANT
SET IRRIG TUBING LAPAROSCOPIC (IRRIGATION / IRRIGATOR) ×2 IMPLANT
SET TUBE SMOKE EVAC HIGH FLOW (TUBING) ×2 IMPLANT
SHEARS HARMONIC ACE PLUS 45CM (MISCELLANEOUS) ×2 IMPLANT
SLEEVE ADV FIXATION 12X100MM (TROCAR) ×4 IMPLANT
SLEEVE ADV FIXATION 5X100MM (TROCAR) IMPLANT
SOL ANTI FOG 6CC (MISCELLANEOUS) ×1 IMPLANT
SOLUTION ANTI FOG 6CC (MISCELLANEOUS) ×1
STAPLER VISISTAT 35W (STAPLE) ×2 IMPLANT
STRIP CLOSURE SKIN 1/2X4 (GAUZE/BANDAGES/DRESSINGS) IMPLANT
SUT RELOAD ENDO STITCH 2 48X1 (ENDOMECHANICALS) ×5
SUT RELOAD ENDO STITCH 2.0 (ENDOMECHANICALS) ×4
SUT SURGIDAC NAB ES-9 0 48 120 (SUTURE) IMPLANT
SUT VIC AB 2-0 SH 27 (SUTURE) ×1
SUT VIC AB 2-0 SH 27X BRD (SUTURE) ×1 IMPLANT
SUT VIC AB 4-0 SH 18 (SUTURE) ×2 IMPLANT
SUTURE RELOAD END STTCH 2 48X1 (ENDOMECHANICALS) ×5 IMPLANT
SUTURE RELOAD ENDO STITCH 2.0 (ENDOMECHANICALS) ×4 IMPLANT
SYR 10ML ECCENTRIC (SYRINGE) ×2 IMPLANT
SYR 20ML LL LF (SYRINGE) ×4 IMPLANT
SYR 50ML LL SCALE MARK (SYRINGE) ×2 IMPLANT
TOWEL OR 17X26 10 PK STRL BLUE (TOWEL DISPOSABLE) ×4 IMPLANT
TOWEL OR NON WOVEN STRL DISP B (DISPOSABLE) ×2 IMPLANT
TRAY FOLEY MTR SLVR 16FR STAT (SET/KITS/TRAYS/PACK) ×2 IMPLANT
TROCAR ADV FIXATION 12X100MM (TROCAR) ×2 IMPLANT
TROCAR ADV FIXATION 5X100MM (TROCAR) ×2 IMPLANT
TROCAR BLADELESS OPT 5 100 (ENDOMECHANICALS) ×2 IMPLANT
TROCAR XCEL 12X100 BLDLESS (ENDOMECHANICALS) ×2 IMPLANT
TUBING CONNECTING 10 (TUBING) ×4 IMPLANT

## 2019-01-25 NOTE — Progress Notes (Signed)
Discussed post op day goals with patient including ambulation, IS, diet progression, pain, and nausea control.  BSTOP education provided including BSTOP information guide, "Guide for Pain Management after your Bariatric Procedure".  Questions answered. 

## 2019-01-25 NOTE — Discharge Instructions (Signed)
° ° ° °GASTRIC BYPASS/SLEEVE ° Home Care Instructions ° ° These instructions are to help you care for yourself when you go home. ° °Call: If you have any problems. °• Call [phone] and ask for the surgeon on call °• If you need immediate help, come to the ER at [facility].  °• Tell the ER staff that you are a new post-op gastric bypass or gastric sleeve patient °  °Signs and symptoms to report: • Severe vomiting or nausea °o If you cannot keep down clear liquids for longer than 1 day, call your surgeon  °• Abdominal pain that does not get better after taking your pain medication °• Fever over 100.4° F with chills °• Heart beating over 100 beats a minute °• Shortness of breath at rest °• Chest pain °•  Redness, swelling, drainage, or foul odor at incision (surgical) sites °•  If your incisions open or pull apart °• Swelling or pain in calf (lower leg) °• Diarrhea (Loose bowel movements that happen often), frequent watery, uncontrolled bowel movements °• Constipation, (no bowel movements for 3 days) if this happens: Pick one °o Milk of Magnesia, 2 tablespoons by mouth, 3 times a day for 2 days if needed °o Stop taking Milk of Magnesia once you have a bowel movement °o Call your doctor if constipation continues °Or °o Miralax  (instead of Milk of Magnesia) following the label instructions °o Stop taking Miralax once you have a bowel movement °o Call your doctor if constipation continues °• Anything you think is not normal °  °Normal side effects after surgery: • Unable to sleep at night or unable to focus °• Irritability or moody °• Being tearful (crying) or depressed °These are common complaints, possibly related to your anesthesia medications that put you to sleep, stress of surgery, and change in lifestyle.  This usually goes away a few weeks after surgery.  If these feelings continue, call your primary care doctor. °  °Wound Care: You may have surgical glue, steri-strips, or staples over your incisions after  surgery °• Surgical glue:  Looks like a clear film over your incisions and will wear off a little at a time °• Steri-strips: Strips of tape over your incisions. You may notice a yellowish color on the skin under the steri-strips. This is used to make the   steri-strips stick better. Do not pull the steri-strips off - let them fall off °• Staples: Staples may be removed before you leave the hospital °o If you go home with staples, call [facility], [phone] at for an appointment with your surgeon’s nurse to have staples removed 10 days after surgery. °• Showering: You may shower two (2) days after your surgery unless your surgeon tells you differently °o Wash gently around incisions with warm soapy water, rinse well, and gently pat dry  °o No tub baths until staples are removed, steri-strips fall off or glue is gone.  °  °Medications: • Medications should be liquid or crushed if larger than the size of a dime °• Extended release pills (medication that release a little bit at a time through the day) should NOT be crushed or cut. (examples include XL, ER, DR, SR) °• Depending on the size and number of medications you take, you may need to space (take a few throughout the day)/change the time you take your medications so that you do not over-fill your pouch (smaller stomach) °• Make sure you follow-up with your primary care doctor to   make medication changes needed during rapid weight loss and life-style changes °• If you have diabetes, follow up with the doctor that orders your diabetes medication(s) within one week after surgery and check your blood sugar regularly. °• Do not drive while taking prescription pain medication  °• It is ok to take Tylenol by the bottle instructions with your pain medicine or instead of your pain medicine as needed.  DO NOT TAKE NSAIDS (EXAMPLES OF NSAIDS:  IBUPROFREN/ NAPROXEN)  °Diet:                    First 2 Weeks ° You will see the dietician t about two (2) weeks  after your surgery. The dietician will increase the types of foods you can eat if you are handling liquids well: °• If you have severe vomiting or nausea and cannot keep down clear liquids lasting longer than 1 day, call your surgeon @ ([phone]) °Protein Shake °• Drink at least 2 ounces of shake 5-6 times per day °• Each serving of protein shakes (usually 8 - 12 ounces) should have: °o 15 grams of protein  °o And no more than 5 grams of carbohydrate  °• Goal for protein each day: °o Men = 80 grams per day °o Women = 60 grams per day °• Protein powder may be added to fluids such as non-fat milk or Lactaid milk or unsweetened Soy/Almond milk (limit to 35 grams added protein powder per serving) ° °Hydration °• Slowly increase the amount of water and other clear liquids as tolerated (See Acceptable Fluids) °• Slowly increase the amount of protein shake as tolerated  °•  Sip fluids slowly and throughout the day.  Do not use straws. °• May use sugar substitutes in small amounts (no more than 6 - 8 packets per day; i.e. Splenda) ° °Fluid Goal °• The first goal is to drink at least 8 ounces of protein shake/drink per day (or as directed by the nutritionist); some examples of protein shakes are Syntrax Nectar, Adkins Advantage, EAS Edge HP, and Unjury. See handout from pre-op Bariatric Education Class: °o Slowly increase the amount of protein shake you drink as tolerated °o You may find it easier to slowly sip shakes throughout the day °o It is important to get your proteins in first °• Your fluid goal is to drink 64 - 100 ounces of fluid daily °o It may take a few weeks to build up to this °• 32 oz (or more) should be clear liquids  °And  °• 32 oz (or more) should be full liquids (see below for examples) °• Liquids should not contain sugar, caffeine, or carbonation ° °Clear Liquids: °• Water or Sugar-free flavored water (i.e. Fruit H2O, Propel) °• Decaffeinated coffee or tea (sugar-free) °• Crystal Lite, Wyler’s Lite,  Minute Maid Lite °• Sugar-free Jell-O °• Bouillon or broth °• Sugar-free Popsicle:   *Less than 20 calories each; Limit 1 per day ° °Full Liquids: °Protein Shakes/Drinks + 2 choices per day of other full liquids °• Full liquids must be: °o No More Than 15 grams of Carbs per serving  °o No More Than 3 grams of Fat per serving °• Strained low-fat cream soup (except Cream of Potato or Tomato) °• Non-Fat milk °• Fat-free Lactaid Milk °• Unsweetened Soy Or Unsweetened Almond Milk °• Low Sugar yogurt (Dannon Lite & Fit, Greek yogurt; Oikos Triple Zero; Chobani Simply 100; Yoplait 100 calorie Greek - No Fruit on the Bottom) ° °  °Vitamins   and Minerals • Start 1 day after surgery unless otherwise directed by your surgeon °• 2 Chewable Bariatric Specific Multivitamin / Multimineral Supplement with iron (Example: Bariatric Advantage Multi EA) °• Chewable Calcium with Vitamin D-3 °(Example: 3 Chewable Calcium Plus 600 with Vitamin D-3) °o Take 500 mg three (3) times a day for a total of 1500 mg each day °o Do not take all 3 doses of calcium at one time as it may cause constipation, and you can only absorb 500 mg  at a time  °o Do not mix multivitamins containing iron with calcium supplements; take 2 hours apart °• Menstruating women and those with a history of anemia (a blood disease that causes weakness) may need extra iron °o Talk with your doctor to see if you need more iron °• Do not stop taking or change any vitamins or minerals until you talk to your dietitian or surgeon °• Your Dietitian and/or surgeon must approve all vitamin and mineral supplements °  °Activity and Exercise: Limit your physical activity as instructed by your doctor.  It is important to continue walking at home.  During this time, use these guidelines: °• Do not lift anything greater than ten (10) pounds for at least two (2) weeks °• Do not go back to work or drive until your surgeon says you can °• You may have sex when you feel comfortable  °o It is  VERY important for female patients to use a reliable birth control method; fertility often increases after surgery  °o All hormonal birth control will be ineffective for 30 days after surgery due to medications given during surgery a barrier method must be used. °o Do not get pregnant for at least 18 months °• Start exercising as soon as your doctor tells you that you can °o Make sure your doctor approves any physical activity °• Start with a simple walking program °• Walk 5-15 minutes each day, 7 days per week.  °• Slowly increase until you are walking 30-45 minutes per day °Consider joining our BELT program. [phone] or email [email or web address] °  °Special Instructions Things to remember: °• Use your CPAP when sleeping if this applies to you ° °• [facility] has two free Bariatric Surgery Support Groups that meet monthly °o The 3rd Thursday of each month, 6 pm, [facility]  °o The 2nd Friday of each month, 11:45 am in the private dining room in the basement of [facility] °• It is very important to keep all follow up appointments with your surgeon, dietitian, primary care physician, and behavioral health practitioner °• Routine follow up schedule with your surgeon include appointments at 2-3 weeks, 6-8 weeks, 6 months, and 1 year at a minimum.  Your surgeon may request to see you more often.   °o After the first year, please follow up with your bariatric surgeon and dietitian at least once a year in order to maintain best weight loss results °[facility]: [phone] °[facility]: [phone] °Bariatric Nurse Coordinator: [phone] °  °   Reviewed and Endorsed  °by [facility] Patient Education Committee, June, 2016 °Edits Approved: Aug, 2018 ° ° ° °

## 2019-01-25 NOTE — Anesthesia Preprocedure Evaluation (Signed)
Anesthesia Evaluation  Patient identified by MRN, date of birth, ID band Patient awake    Reviewed: Allergy & Precautions, H&P , NPO status , Patient's Chart, lab work & pertinent test results  Airway Mallampati: II   Neck ROM: full    Dental   Pulmonary neg pulmonary ROS,    breath sounds clear to auscultation       Cardiovascular hypertension,  Rhythm:regular Rate:Normal     Neuro/Psych PSYCHIATRIC DISORDERS Anxiety    GI/Hepatic   Endo/Other    Renal/GU      Musculoskeletal   Abdominal   Peds  Hematology   Anesthesia Other Findings   Reproductive/Obstetrics                             Anesthesia Physical Anesthesia Plan  ASA: II  Anesthesia Plan: General   Post-op Pain Management:    Induction: Intravenous  PONV Risk Score and Plan: 3 and Ondansetron, Dexamethasone, Midazolam and Treatment may vary due to age or medical condition  Airway Management Planned: Oral ETT  Additional Equipment:   Intra-op Plan:   Post-operative Plan: Extubation in OR  Informed Consent: I have reviewed the patients History and Physical, chart, labs and discussed the procedure including the risks, benefits and alternatives for the proposed anesthesia with the patient or authorized representative who has indicated his/her understanding and acceptance.       Plan Discussed with: CRNA, Surgeon and Anesthesiologist  Anesthesia Plan Comments:         Anesthesia Quick Evaluation

## 2019-01-25 NOTE — Op Note (Signed)
25 January 2019 Surgeon: Althea Grimmer. Hassell Done, MD, FACS Asst:  Romana Juniper, MD, FACS Anesthesia: General endotracheal Drains: None  Procedure: Laparoscopic Roux en Y gastric bypass with 40 cm BP limb and 100 cm Roux limb, antecolic, antegastric, candy cane to the left.  Closure of Peterson's defect. Upper endoscopy by Dr. Kae Heller.   Description of Procedure:  The patient was taken to OR 1 at [facility] and given general anesthesia.  The abdomen was prepped with chloroprep and draped sterilely.  A time out was performed.    The operation began by identifying the ligament of Treitz. I measured 40 cm downstream and divided the bowel with a 6 cm Covidian stapler.  I sutured a Penrose drain along the Roux limb end.  I measured a 1 meter (100 cm) Roux limb and then placed the distal bowels to the BP limb side by side and performed a stapled jejunojejunostomy. The common defect was closed from either end with 4-0 Vicryl using the Endo Stitch. The mesenteric defect was closed with a running 2-0 silk using the Endo Stitch. Tisseel was applied to the suture line.  The omentum was divided with the harmonic scalpel.  The Nathanson retractor was inserted in the left lateral segment of liver was retracted. The foregut dissection ensued.  A 5 cm pouch was measured and created with multiple firings of the Covidian purple load-the first two without TRS.    The Roux limb was then brought up with the candycane pointed left and a back row of sutures of 2-0 Vicryl were placed. I opened along the right side of each structure and inserted the 4.5 cm stapler to create the gastrojejunostomy. The common defect was closed from either end with 2-0 Vicryl and a second row was placed anterior to that the Ewald tube acting as a stent across the anastomosis. The Penrose drain was removed. Peterson's defect was closed with 2-0 silk.   Endoscopy was performed by Dr. Kae Heller revealed a 5 cm pouch and no bubbles were seen.   The incisions were  injected with Exparel at the beginning of the case as a TAP block and were closed with 4-0 Monocryl and Dermabond.  The patient was taken to the recovery room in satisfactory condition.  Matt B. Hassell Done, MD, FACS

## 2019-01-25 NOTE — Progress Notes (Signed)
PHARMACY CONSULT FOR:  Risk Assessment for Post-Discharge VTE Following Bariatric Surgery  Post-Discharge VTE Risk Assessment: This patient's probability of 30-day post-discharge VTE is increased due to the factors marked:   Female    Age >/=60 years  x  BMI >/=50 kg/m2    CHF    Dyspnea at Rest    Paraplegia   x Non-gastric-band surgery    Operation Time >/=3 hr    Return to OR     Length of Stay >/= 3 d      Hx of VTE   Hypercoagulable condition   Significant venous stasis   Predicted probability of 30-day post-discharge VTE: 0.27% Other patient-specific factors to consider:   Recommendation for Discharge: No pharmacologic prophylaxis post-discharge   Tara Bush is a 28 y.o. female who underwent Laparoscopic Roux en Y gastric bypass on 01/25/19   Case start: 0810 Case end: 1039   Allergies  Allergen Reactions  . Other Itching    Pomegranate    Patient Measurements: Height: 5\' 6"  (167.6 cm) Weight: (!) 405 lb (183.7 kg) IBW/kg (Calculated) : 59.3 Body mass index is 65.37 kg/m.  No results for input(s): WBC, HGB, HCT, PLT, APTT, CREATININE, LABCREA, CREATININE, CREAT24HRUR, MG, PHOS, ALBUMIN, PROT, ALBUMIN, AST, ALT, ALKPHOS, BILITOT, BILIDIR, IBILI in the last 72 hours. Estimated Creatinine Clearance: 183.5 mL/min (by C-G formula based on SCr of 0.74 mg/dL).    Past Medical History:  Diagnosis Date  . Dysfunctional uterine bleeding   . Hypertension    started lisinopril 1/16  . Obesity   . Post traumatic stress disorder (PTSD)      Medications Prior to Admission  Medication Sig Dispense Refill Last Dose  . acetaminophen (TYLENOL) 500 MG tablet Take 1,000 mg by mouth every 6 (six) hours as needed for moderate pain or headache.   01/22/2019  . calcium carbonate (OS-CAL - DOSED IN MG OF ELEMENTAL CALCIUM) 1250 (500 Ca) MG tablet Take 1 tablet by mouth 3 (three) times daily.   01/24/2019 at Unknown time  . citalopram (CELEXA) 40 MG tablet TAKE 1  TABLET (40 MG TOTAL) BY MOUTH DAILY. 90 tablet 0 01/20/2019  . hydrochlorothiazide (HYDRODIURIL) 25 MG tablet TAKE 1 TABLET (25 MG TOTAL) BY MOUTH DAILY. 90 tablet 1 01/24/2019 at Unknown time  . Multiple Vitamins-Minerals (BARIATRIC FUSION) CHEW Chew 2 tablets by mouth daily with breakfast.   01/24/2019 at Unknown time  . Melatonin 10 MG CAPS Take 20-30 mg by mouth at bedtime as needed (sleep).   More than a month at Unknown time       [place] 01/25/2019, 12:09 PM

## 2019-01-25 NOTE — Progress Notes (Signed)
Water started at [employer].

## 2019-01-25 NOTE — Interval H&P Note (Signed)
History and Physical Interval Note:  01/25/2019 7:17 AM  Tara Bush  has presented today for surgery, with the diagnosis of Morbid Obesity.  The various methods of treatment have been discussed with the patient and family. After consideration of risks, benefits and other options for treatment, the patient has consented to  Procedure(s): LAPAROSCOPIC ROUX-EN-Y GASTRIC BYPASS WITH UPPER ENDOSCOPY, ERAS Pathway (N/A) as a surgical intervention.  The patient's history has been reviewed, patient examined, no change in status, stable for surgery.  I have reviewed the patient's chart and labs.  Questions were answered to the patient's satisfaction.     [name]

## 2019-01-25 NOTE — Anesthesia Procedure Notes (Signed)
Procedure Name: Intubation Date/Time: 01/25/2019 7:42 AM Performed by: Maxwell Caul, CRNA Pre-anesthesia Checklist: Patient identified, Emergency Drugs available, Suction available and Patient being monitored Patient Re-evaluated:Patient Re-evaluated prior to induction Oxygen Delivery Method: Circle system utilized Preoxygenation: Pre-oxygenation with 100% oxygen Induction Type: IV induction Ventilation: Mask ventilation without difficulty Laryngoscope Size: Mac and 4 Grade View: Grade I Tube type: Oral Tube size: 7.5 mm Number of attempts: 1 Airway Equipment and Method: Stylet Placement Confirmation: ETT inserted through vocal cords under direct vision,  positive ETCO2 and breath sounds checked- equal and bilateral Secured at: 22 cm Tube secured with: Tape Dental Injury: Teeth and Oropharynx as per pre-operative assessment

## 2019-01-25 NOTE — Op Note (Signed)
Preoperative diagnosis: Roux-en-Y gastric bypass  Postoperative diagnosis: Same   Procedure: Upper endoscopy   Surgeon: Clovis Riley, M.D.  Anesthesia: Gen.   Description of procedure: The endoscopy was placed in the mouth and into the oropharynx and under endoscopic vision it was advanced to the esophagogastric junction which was identified at 39 from the teeth. With the roux limb occluded, the pouch was tensely insufflated while the upper abdomen was flooded with irrigation to perform a leak test, which was negative. No bubbles were seen.  The staple line and anastomosis are hemostatic and the the gastric pouch is tubular with no retained fundus. The pouch is 6cm in length. The lumen was decompressed and the scope was withdrawn without difficulty.    Clovis Riley, M.D. General, Bariatric, & Minimally Invasive Surgery [facility]

## 2019-01-25 NOTE — Transfer of Care (Signed)
Immediate Anesthesia Transfer of Care Note  Patient: [name]  Procedure(s) Performed: LAPAROSCOPIC ROUX-EN-Y GASTRIC BYPASS WITH UPPER ENDOSCOPY, ERAS Pathway (N/A )  Patient Location: PACU  Anesthesia Type:General  Level of Consciousness: awake, alert  and oriented  Airway & Oxygen Therapy: Patient Spontanous Breathing and Patient connected to face mask oxygen  Post-op Assessment: Report given to RN and Post -op Vital signs reviewed and stable  Post vital signs: Reviewed and stable  Last Vitals:  Vitals Value Taken Time  BP 174/109 01/25/19 1054  Temp    Pulse 73 01/25/19 1055  Resp 18 01/25/19 1055  SpO2 100 % 01/25/19 1055  Vitals shown include unvalidated device data.  Last Pain:  Vitals:   01/25/19 0601  TempSrc: Oral  PainSc: 3       Patients Stated Pain Goal: 2 (03/49/17 9150)  Complications: No apparent anesthesia complications

## 2019-01-25 NOTE — Progress Notes (Signed)
Called MD concerning BP, orders given.

## 2019-01-26 ENCOUNTER — Encounter: Payer: Self-pay | Admitting: *Deleted

## 2019-01-26 LAB — CBC WITH DIFFERENTIAL/PLATELET
Abs Immature Granulocytes: 0.08 10*3/uL — ABNORMAL HIGH (ref 0.00–0.07)
Basophils Absolute: 0 10*3/uL (ref 0.0–0.1)
Basophils Relative: 0 %
Eosinophils Absolute: 0 10*3/uL (ref 0.0–0.5)
Eosinophils Relative: 0 %
HCT: 41.7 % (ref 36.0–46.0)
Hemoglobin: 13.2 g/dL (ref 12.0–15.0)
Immature Granulocytes: 1 %
Lymphocytes Relative: 13 %
Lymphs Abs: 2.1 10*3/uL (ref 0.7–4.0)
MCH: 28.9 pg (ref 26.0–34.0)
MCHC: 31.7 g/dL (ref 30.0–36.0)
MCV: 91.2 fL (ref 80.0–100.0)
Monocytes Absolute: 1.3 10*3/uL — ABNORMAL HIGH (ref 0.1–1.0)
Monocytes Relative: 8 %
Neutro Abs: 12.7 10*3/uL — ABNORMAL HIGH (ref 1.7–7.7)
Neutrophils Relative %: 78 %
Platelets: 333 10*3/uL (ref 150–400)
RBC: 4.57 MIL/uL (ref 3.87–5.11)
RDW: 13.1 % (ref 11.5–15.5)
WBC: 16.2 10*3/uL — ABNORMAL HIGH (ref 4.0–10.5)
nRBC: 0 % (ref 0.0–0.2)

## 2019-01-26 MED ORDER — OXYCODONE HCL 5 MG/5ML PO SOLN
5.0000 mg | ORAL | Status: DC | PRN
  Administered 2019-01-26 – 2019-01-27 (×3): 5 mg via ORAL
  Filled 2019-01-26 (×4): qty 5

## 2019-01-26 NOTE — Progress Notes (Signed)
Nutrition Note  RD consulted for diet education for patient s/p bariatric surgery. Bariatric nurse coordinator providing education at this time.  If nutrition issues arise, please consult RD.   Kroy Sprung, MS, RD, LDN Inpatient Clinical Dietitian Pager: [phone] After Hours Pager: [phone]  

## 2019-01-26 NOTE — Progress Notes (Signed)
Patient alert and oriented, Post op day 1.  Provided support and encouragement.  Encouraged pulmonary toilet, ambulation and small sips of liquids.  Working on last cup of water, discussed progression to protein. All questions answered.  Will continue to monitor.

## 2019-01-26 NOTE — Progress Notes (Deleted)
Discussed post op day goals with patient including ambulation, IS, diet progression, pain, and nausea control.  

## 2019-01-26 NOTE — Progress Notes (Signed)
Pt started drinking first 2 oz cup of protein at 1018.

## 2019-01-26 NOTE — Progress Notes (Signed)
Patient ID: Tara Bush, female   DOB: [birth year], 28 y.o.   MRN: [id number] [facility] Progress Note:   1 Day Post-Op  Subjective: Mental status is clear;  Having some nausea with liquids Objective: Vital signs in last 24 hours: Temp:  [97.6 F (36.4 C)-98.3 F (36.8 C)] 98.2 F (36.8 C) (12/15 1410) Pulse Rate:  [67-96] 71 (12/15 1410) Resp:  [18-20] 18 (12/15 1410) BP: (146-194)/(77-103) 158/85 (12/15 1410) SpO2:  [95 %-99 %] 97 % (12/15 1410)  Intake/Output from previous day: 12/14 0701 - 12/15 0700 In: 5388.3 [P.O.:210; I.V.:5078.3; IV Piggyback:100] Out: 7096 [Urine:3525; Blood:125] Intake/Output this shift: Total I/O In: 1005 [P.O.:330; I.V.:675] Out: 600 [Urine:600]  Physical Exam: Work of breathing is not labored.  Incisions OK Not ready for discharge yet.    Lab Results:  Results for orders placed or performed during the hospital encounter of 01/25/19 (from the past 48 hour(s))  Pregnancy, urine     Status: None   Collection Time: 01/25/19  5:34 AM  Result Value Ref Range   Preg Test, Ur NEGATIVE NEGATIVE    Comment:        THE SENSITIVITY OF THIS METHODOLOGY IS >20 mIU/mL. Performed at [facility], [place] [address], [place], [place] [address]   CBC     Status: Abnormal   Collection Time: 01/25/19  2:47 PM  Result Value Ref Range   WBC 17.6 (H) 4.0 - 10.5 K/uL   RBC 4.85 3.87 - 5.11 MIL/uL   Hemoglobin 13.9 12.0 - 15.0 g/dL   HCT 43.3 36.0 - 46.0 %   MCV 89.3 80.0 - 100.0 fL   MCH 28.7 26.0 - 34.0 pg   MCHC 32.1 30.0 - 36.0 g/dL   RDW 12.6 11.5 - 15.5 %   Platelets 323 150 - 400 K/uL   nRBC 0.0 0.0 - 0.2 %    Comment: Performed at [facility], [place] [address] [place], [place] [address]  Creatinine, serum     Status: None   Collection Time: 01/25/19  2:47 PM  Result Value Ref Range   Creatinine, Ser 0.78 0.44 - 1.00 mg/dL   GFR calc non Af Amer >60 >60 mL/min   GFR calc Af Amer >60 >60 mL/min     Comment: Performed at [facility], [place] [address], [place], [place] [address]  CBC WITH DIFFERENTIAL     Status: Abnormal   Collection Time: 01/26/19  5:19 AM  Result Value Ref Range   WBC 16.2 (H) 4.0 - 10.5 K/uL   RBC 4.57 3.87 - 5.11 MIL/uL   Hemoglobin 13.2 12.0 - 15.0 g/dL   HCT 41.7 36.0 - 46.0 %   MCV 91.2 80.0 - 100.0 fL   MCH 28.9 26.0 - 34.0 pg   MCHC 31.7 30.0 - 36.0 g/dL   RDW 13.1 11.5 - 15.5 %   Platelets 333 150 - 400 K/uL   nRBC 0.0 0.0 - 0.2 %   Neutrophils Relative % 78 %   Neutro Abs 12.7 (H) 1.7 - 7.7 K/uL   Lymphocytes Relative 13 %   Lymphs Abs 2.1 0.7 - 4.0 K/uL   Monocytes Relative 8 %   Monocytes Absolute 1.3 (H) 0.1 - 1.0 K/uL   Eosinophils Relative 0 %   Eosinophils Absolute 0.0 0.0 - 0.5 K/uL   Basophils Relative 0 %   Basophils Absolute 0.0 0.0 - 0.1 K/uL   Immature Granulocytes 1 %   Abs Immature  Granulocytes 0.08 (H) 0.00 - 0.07 K/uL    Comment: Performed at [facility], [address] [place], [place] [address]    Radiology/Results: No results found.  Anti-infectives: Anti-infectives (From admission, onward)   Start     Dose/Rate Route Frequency Ordered Stop   01/25/19 0600  cefoTEtan (CEFOTAN) 2 g in sodium chloride 0.9 % 100 mL IVPB     2 g 200 mL/hr over 30 Minutes Intravenous On call to O.R. 01/25/19 0532 01/25/19 0815      Assessment/Plan: Problem List: Patient Active Problem List   Diagnosis Date Noted  . Lap Roux en Y gastric bypass Dec 2020 01/25/2019  . Encounter for insertion of mirena IUD 10/18/2015  . BMI 60.0-69.9, adult (HCC) 03/30/2015  . PCOS (polycystic ovarian syndrome) 03/30/2015  . DUB (dysfunctional uterine bleeding) 06/07/2014  . Hypertension   . Morbid obesity (HCC) 05/20/2013    Stable post op but not taking enough PO for discharge 1 Day Post-Op    LOS: 1 day   Matt B. Daphine Deutscher, MD, [facility] [phone]  beeper [phone]  01/26/2019 3:26 PM

## 2019-01-27 LAB — CBC WITH DIFFERENTIAL/PLATELET
Abs Immature Granulocytes: 0.04 10*3/uL (ref 0.00–0.07)
Basophils Absolute: 0.1 10*3/uL (ref 0.0–0.1)
Basophils Relative: 0 %
Eosinophils Absolute: 0.1 10*3/uL (ref 0.0–0.5)
Eosinophils Relative: 0 %
HCT: 37.8 % (ref 36.0–46.0)
Hemoglobin: 11.7 g/dL — ABNORMAL LOW (ref 12.0–15.0)
Immature Granulocytes: 0 %
Lymphocytes Relative: 35 %
Lymphs Abs: 4.9 10*3/uL — ABNORMAL HIGH (ref 0.7–4.0)
MCH: 28.7 pg (ref 26.0–34.0)
MCHC: 31 g/dL (ref 30.0–36.0)
MCV: 92.6 fL (ref 80.0–100.0)
Monocytes Absolute: 1 10*3/uL (ref 0.1–1.0)
Monocytes Relative: 7 %
Neutro Abs: 8.1 10*3/uL — ABNORMAL HIGH (ref 1.7–7.7)
Neutrophils Relative %: 58 %
Platelets: 299 10*3/uL (ref 150–400)
RBC: 4.08 MIL/uL (ref 3.87–5.11)
RDW: 13.4 % (ref 11.5–15.5)
WBC: 14.1 10*3/uL — ABNORMAL HIGH (ref 4.0–10.5)
nRBC: 0 % (ref 0.0–0.2)

## 2019-01-27 MED ORDER — PANTOPRAZOLE SODIUM 40 MG PO TBEC: 40.0000 mg | DELAYED_RELEASE_TABLET | Freq: Every day | ORAL | 0 refills | Status: DC

## 2019-01-27 MED ORDER — OXYCODONE HCL 5 MG PO TABS: 5.0000 mg | ORAL_TABLET | Freq: Four times a day (QID) | ORAL | 0 refills | Status: DC | PRN

## 2019-01-27 MED ORDER — ONDANSETRON 4 MG PO TBDP: 4.0000 mg | ORAL_TABLET | Freq: Four times a day (QID) | ORAL | 0 refills | Status: DC | PRN

## 2019-01-27 NOTE — Progress Notes (Signed)
Patient alert and oriented, pain is controlled. Patient is tolerating fluids, advanced to protein shake today, patient is tolerating well. Reviewed Gastric Bypass discharge instructions with patient and patient is able to articulate understanding. Provided information on BELT program, Support Group and [facility] outpatient pharmacy. All questions answered, will continue to monitor.   Total fluid intake 660 Per dehydration protocol call back one week post op 

## 2019-01-27 NOTE — Anesthesia Postprocedure Evaluation (Signed)
Anesthesia Post Note  Patient: Tara Bush  Procedure(s) Performed: LAPAROSCOPIC ROUX-EN-Y GASTRIC BYPASS WITH UPPER ENDOSCOPY, ERAS Pathway (N/A )     Patient location during evaluation: PACU Anesthesia Type: General Level of consciousness: awake and alert Pain management: pain level controlled Vital Signs Assessment: post-procedure vital signs reviewed and stable Respiratory status: spontaneous breathing, nonlabored ventilation, respiratory function stable and patient connected to nasal cannula oxygen Cardiovascular status: blood pressure returned to baseline and stable Postop Assessment: no apparent nausea or vomiting Anesthetic complications: no    Last Vitals:  Vitals:   01/27/19 0150 01/27/19 0616  BP: 140/76 129/74  Pulse: 73 68  Resp: 18 16  Temp: 36.6 C 36.7 C  SpO2: 97% 96%    Last Pain:  Vitals:   01/27/19 0616  TempSrc: Oral  PainSc:                  [place] S

## 2019-01-27 NOTE — Progress Notes (Signed)
Pt alert and oriented, d/c instructions given by Parks Neptune, RN. Pt d/cd home.

## 2019-01-27 NOTE — Plan of Care (Signed)
All goals met for d/c 

## 2019-01-27 NOTE — Discharge Summary (Signed)
Physician Discharge Summary  Patient ID: Tara Bush MRN: [id number] DOB/AGE: [birth year] 28 y.o.  PCP: Levonne Lapping, NP (Inactive)  Admit date: 01/25/2019 Discharge date: 01/27/2019  Admission Diagnoses:  Morbid obesity BMI 65  Discharge Diagnoses:  same  Principal Problem:   Lap Roux en Y gastric bypass Dec 2020   Surgery:  Lap roux en Y gastric bypass  Discharged Condition: improved  Hospital Course:   Had surgery on Monday.  Slow to take liquids and advance.  Drinking OK and ready for discharge on Wednesday.   Consults: none  Significant Diagnostic Studies: none    Discharge Exam: Blood pressure 134/79, pulse 68, temperature 98.1 F (36.7 C), temperature source Oral, resp. rate 18, height 5\' 6"  (1.676 m), weight (!) 183.7 kg, SpO2 95 %. Incisions ok  Disposition: Discharge disposition: 01-Home or Self Care       Discharge Instructions    Ambulate hourly while awake   Complete by: As directed    Call MD for:  difficulty breathing, headache or visual disturbances   Complete by: As directed    Call MD for:  persistant dizziness or light-headedness   Complete by: As directed    Call MD for:  persistant nausea and vomiting   Complete by: As directed    Call MD for:  redness, tenderness, or signs of infection (pain, swelling, redness, odor or green/yellow discharge around incision site)   Complete by: As directed    Call MD for:  severe uncontrolled pain   Complete by: As directed    Call MD for:  temperature >101 F   Complete by: As directed    Diet bariatric full liquid   Complete by: As directed    Incentive spirometry   Complete by: As directed    Perform hourly while awake     Allergies as of 01/27/2019      Reactions   Other Itching   Pomegranate      Medication List    TAKE these medications   acetaminophen 500 MG tablet Commonly known as: TYLENOL Take 1,000 mg by mouth every 6 (six) hours as needed for moderate pain or headache.   Bariatric Fusion Chew Chew 2 tablets by mouth daily with breakfast.   calcium carbonate 1250 (500 Ca) MG tablet Commonly known as: OS-CAL - dosed in mg of elemental calcium Take 1 tablet by mouth 3 (three) times daily.   citalopram 40 MG tablet Commonly known as: CELEXA TAKE 1 TABLET (40 MG TOTAL) BY MOUTH DAILY.   hydrochlorothiazide 25 MG tablet Commonly known as: HYDRODIURIL TAKE 1 TABLET (25 MG TOTAL) BY MOUTH DAILY. Notes to patient: Monitor Blood Pressure Daily and keep a log for primary care physician.  Monitor for symptoms of dehydration.  You may need to make changes to your medications with rapid weight loss.     Melatonin 10 MG Caps Take 20-30 mg by mouth at bedtime as needed (sleep).   ondansetron 4 MG disintegrating tablet Commonly known as: ZOFRAN-ODT Take 1 tablet (4 mg total) by mouth every 6 (six) hours as needed for nausea or vomiting.   oxyCODONE 5 MG immediate release tablet Commonly known as: Oxy IR/ROXICODONE Take 1 tablet (5 mg total) by mouth every 6 (six) hours as needed for severe pain.   pantoprazole 40 MG tablet Commonly known as: PROTONIX Take 1 tablet (40 mg total) by mouth daily.      Follow-up Information    [facility] 01/29/2019. Go on 02/24/2019.  Specialty: General Surgery Why: at 9 Contact information: [address] [place] [address] [phone]        Carlena Hurl, PA-C. Go on 03/19/2019.   Specialty: General Surgery Why: at [address] information: [place] [address] [phone]           Signed: [name] 01/27/2019, 10:16 AM

## 2019-02-01 ENCOUNTER — Telehealth (HOSPITAL_COMMUNITY): Payer: Self-pay

## 2019-02-01 NOTE — Telephone Encounter (Signed)
Patient called to discuss post bariatric surgery follow up questions.  See below:   1.  Tell me about your pain and pain management?denies  2.  Let's talk about fluid intake.  How much total fluid are you taking in?50 ounces  3.  How much protein have you taken in the last 2 days?45 grams  4.  Have you had nausea?  Tell me about when have experienced nausea and what you did to help?denies  5.  Has the frequency or color changed with your urine?no problems  6.  Tell me what your incisions look like?no problems  7.  Have you been passing gas? BM?had bm since been home  8.  If a problem or question were to arise who would you call?  Do you know contact numbers for [place], [facility], and [facility]?aware of how to contact all services  9.  How has the walking going?walking reguarly  10.  How are your vitamins and calcium going?  How are you taking them?mvi and calcium without difficulty

## 2019-02-08 DIAGNOSIS — Z9884 Bariatric surgery status: Secondary | ICD-10-CM | POA: Diagnosis not present

## 2019-02-08 DIAGNOSIS — Z6841 Body Mass Index (BMI) 40.0 and over, adult: Secondary | ICD-10-CM | POA: Diagnosis not present

## 2019-02-09 ENCOUNTER — Other Ambulatory Visit: Payer: Self-pay

## 2019-02-09 ENCOUNTER — Encounter: Payer: BC Managed Care – PPO | Attending: Surgery | Admitting: Skilled Nursing Facility1

## 2019-02-09 DIAGNOSIS — E669 Obesity, unspecified: Secondary | ICD-10-CM | POA: Diagnosis not present

## 2019-02-11 NOTE — Progress Notes (Signed)
2 Week Post-Operative Nutrition Class   Patient was seen on 04/07/18 for Post-Operative Nutrition education at [facility].    Surgery date: 01/25/2019 Surgery type: RYGB Start weight at [facility]: 414.8 Weight today: 381.1   Body Composition Scale 02/09/2019  Total Body Fat % 51.8  Visceral Fat 18  Fat-Free Mass % 48.1   Total Body Water % 38.5   Muscle-Mass lbs 36.2  Body Fat Displacement          Torso  lbs 122.8         Left Leg  lbs 24.5         Right Leg  lbs 24.5         Left Arm  lbs 12.2         Right Arm   lbs 12.2     The following the learning objectives were met by the patient during this course:  Identifies Phase 3 (Soft, High Proteins) Dietary Goals and will begin from 2 weeks post-operatively to 2 months post-operatively  Identifies appropriate sources of fluids and proteins   States protein recommendations and appropriate sources post-operatively  Identifies the need for appropriate texture modifications, mastication, and bite sizes when consuming solids  Identifies appropriate multivitamin and calcium sources post-operatively  Describes the need for physical activity post-operatively and will follow MD recommendations  States when to call healthcare provider regarding medication questions or post-operative complications   Handouts given during class include:  Phase 3A: Soft, High Protein Diet Handout   Follow-Up Plan: Patient will follow-up at [facility] in 6 weeks for 2 month post-op nutrition visit for diet advancement per MD.

## 2019-02-15 ENCOUNTER — Telehealth: Payer: Self-pay | Admitting: Dietician

## 2019-02-15 NOTE — Telephone Encounter (Signed)
Patient called with questions regarding protein intake. States she is having trouble eating adequate amounts of protein to meet her daily protein goal, states she doesn't feel hungry and gets past fullness after just a few bites of food. Patients questions and concerns were addressed.   Surgery Date: 01/25/2019 Surgery Type: RYGB  Plan to follow up with patient at next scheduled nutrition visit on 03/22/2019.   Herschel [place]) Kameah Rawl, MS, RD, LDN

## 2019-02-16 ENCOUNTER — Telehealth: Payer: Self-pay | Admitting: Skilled Nursing Facility1

## 2019-02-16 NOTE — Telephone Encounter (Signed)
RD called pt to verify fluid intake once starting soft, solid proteins 2 week post-bariatric surgery.   Daily Fluid intake: Daily Protein intake:  Concerns/issues:   LVM 

## 2019-02-25 ENCOUNTER — Telehealth: Payer: Self-pay | Admitting: Dietician

## 2019-02-25 NOTE — Telephone Encounter (Signed)
Patient called with questions regarding appropriate foods during current diet phase post bariatric surgery, such as beans and sausage/bacon/pepperoni. Patients questions were answered.   Plan to follow up with patient in about 1 month for diet advancement.    Tara Bush [place]) Short, MS, RD, LDN

## 2019-03-03 ENCOUNTER — Telehealth: Payer: Self-pay | Admitting: Advanced Practice Midwife

## 2019-03-03 NOTE — Telephone Encounter (Signed)
Called patient regarding appointment scheduled in our office and advised to come alone to the visit, however, a support person, over age 27, may accompany her to appointment if assistance is needed for safety or care concerns. Otherwise, support persons should remain outside until the visit is complete.   Prescreen questions asked: 1. Any of the following symptoms of COVID such as chills, fever, cough, shortness of breath, muscle pain, diarrhea, rash, vomiting, abdominal pain, red eye, weakness, bruising, bleeding, joint pain, loss of taste or smell, a severe headache, sore throat, fatigue 2. Any exposure to anyone suspected or confirmed of having COVID-19 3. Awaiting test results for COVID-19  Also,to keep you safe, please use the provided hand sanitizer when you enter the office. We are asking everyone in the office to wear a mask to help prevent the spread of germs. If you have a mask of your own, please wear it to your appointment, if not, we are happy to provide one for you.  Thank you for understanding and your cooperation.    [facility] Staff      

## 2019-03-04 ENCOUNTER — Encounter: Payer: Self-pay | Admitting: Advanced Practice Midwife

## 2019-03-04 ENCOUNTER — Ambulatory Visit (INDEPENDENT_AMBULATORY_CARE_PROVIDER_SITE_OTHER): Payer: BC Managed Care – PPO | Admitting: Advanced Practice Midwife

## 2019-03-04 ENCOUNTER — Other Ambulatory Visit: Payer: Self-pay

## 2019-03-04 VITALS — Ht 65.0 in | Wt 368.8 lb

## 2019-03-04 DIAGNOSIS — Z30432 Encounter for removal of intrauterine contraceptive device: Secondary | ICD-10-CM

## 2019-03-04 DIAGNOSIS — Z3202 Encounter for pregnancy test, result negative: Secondary | ICD-10-CM

## 2019-03-04 LAB — POCT URINE PREGNANCY: Preg Test, Ur: NEGATIVE

## 2019-03-04 NOTE — Patient Instructions (Signed)

## 2019-03-04 NOTE — Progress Notes (Signed)
    GYNECOLOGY OFFICE PROCEDURE NOTE  Tara Bush is a 29 y.o. G0P0000 here for IUD removal and reinsertion. She is s/p bariatric surgery in December 2020 and would like to see how her post-op weight loss impacts her cycles. She also desires pregnancy and has reviewed timing with her surgeon. No GYN concerns.  Last pap smear was on April 2016  and was normal.  IUD Removal  Patient identified, informed consent performed, consent signed.   Discussed risks of irregular bleeding, cramping, infection, malpositioning or misplacement of the IUD outside the uterus which may require further procedures. Also discussed >99% contraception efficacy, increased risk of ectopic pregnancy with failure of method.  Advised to use backup contraception for one week as the risk of pregnancy is higher during the transition period of removing an IUD and replacing it with another one. Time out was performed. Speculum placed in the vagina. The strings of the IUD were grasped and pulled using ring forceps. The IUD was successfully removed in its entirety. Tolerated very well by patient!     Patient was given post-procedure instructions.    Clayton Bibles, MSN, CNM Certified Nurse Midwife, [facility] for [employer], [facility] 03/04/19 9:02 AM

## 2019-03-19 ENCOUNTER — Telehealth: Payer: Self-pay | Admitting: Advanced Practice Midwife

## 2019-03-19 NOTE — Telephone Encounter (Signed)

## 2019-03-22 ENCOUNTER — Other Ambulatory Visit: Payer: BC Managed Care – PPO | Admitting: Advanced Practice Midwife

## 2019-03-22 ENCOUNTER — Encounter: Payer: BC Managed Care – PPO | Attending: Surgery | Admitting: Dietician

## 2019-03-22 ENCOUNTER — Encounter: Payer: Self-pay | Admitting: Dietician

## 2019-03-22 ENCOUNTER — Other Ambulatory Visit: Payer: Self-pay

## 2019-03-22 DIAGNOSIS — E669 Obesity, unspecified: Secondary | ICD-10-CM | POA: Diagnosis not present

## 2019-03-22 NOTE — Progress Notes (Signed)
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    NUTRITION ASSESSMENT  Anthropometrics  Start weight at [facility]: 414.8 lbs (date: 11/20/2018) Today's weight: 353.3 lbs  Body Composition Scale 02/09/2019 03/21/2018  Weight  lbs 381.1 253.3  BMI 61.54 57.1  Total Body Fat  % 51.8 50.5     Visceral Fat 18 17  Fat-Free Mass  % 48.1 49.4     Total Body Water  % 38.5 39.2     Muscle-Mass  lbs 36.2 35.9  Body Fat Displacement --- ---         Torso  lbs 122.8 111         Left Leg  lbs 24.5 22.2         Right Leg  lbs 24.5 22.2         Left Arm  lbs 12.2 11.1         Right Arm  lbs 12.2 11.1   Lifestyle & Dietary Hx Patient states it can be difficult to coordinate meals/snacks when she is working (works as a Administrator, 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 Drinks lots of water and sometimes Gatorade Zero. May also have protein shakes as needed to meet protein goal.   24-Hr Dietary Recall First Meal: [place] sausage  Snack: -  Second Meal: [place] pepperoni + ham + cheese  Snack: -  Third Meal: chicken  Snack: - Beverages: water, Gatorade Zero   Estimated daily fluid intake: 64+ oz Estimated daily protein intake: 60 g Supplements: bariatric MVI, calcium, hair/skin/nails  Current average weekly physical activity: walking, light weights    Post-Op Goals/ Signs/ Symptoms Using straws: sometimes Drinking while eating: no Chewing/swallowing difficulties: no Changes in vision: no Changes to  mood/headaches: no Hair loss/changes to skin/nails: no Difficulty focusing/concentrating: no Sweating: no Dizziness/lightheadedness: no Palpitations: no  Carbonated/caffeinated beverages: no N/V/D/C/Gas: no Abdominal pain: no Dumping syndrome: no   NUTRITION DIAGNOSIS  Overweight/obesity ([place]-3.3) related to past poor dietary habits and physical inactivity as evidenced by completed bariatric surgery and following dietary guidelines for continued weight loss and healthy nutrition status.   NUTRITION INTERVENTION Nutrition counseling (C-1) and education (E-2) to facilitate bariatric surgery goals, including: . Diet advancement to the next phase (phase 4) now including non-starchy vegetables  . The importance of consuming adequate calories as well as certain nutrients daily due to the body's need for essential vitamins, minerals, and fats . The importance of daily physical activity and to reach a goal of at least 150 minutes of moderate to vigorous physical activity weekly (or as directed by their physician) due to benefits such as increased musculature and improved lab values  Handouts Provided Include   Phase 4: Protein + Non-Starchy Vegetables   Learning Style & Readiness for Change Teaching method utilized: Visual & Auditory  Demonstrated degree of understanding via: Teach Back  Barriers to learning/adherence to lifestyle change: None Identified    MONITORING & EVALUATION Dietary intake, weekly physical activity, body weight, and goals in 4 months.  Next  Steps Patient is to follow-up in 4 months for 6 month post-op follow-up.

## 2019-03-22 NOTE — Patient Instructions (Signed)

## 2019-07-20 ENCOUNTER — Ambulatory Visit: Payer: BC Managed Care – PPO

## 2019-08-11 ENCOUNTER — Encounter: Payer: Self-pay | Attending: Surgery | Admitting: Skilled Nursing Facility1

## 2019-08-11 DIAGNOSIS — Z6841 Body Mass Index (BMI) 40.0 and over, adult: Secondary | ICD-10-CM | POA: Insufficient documentation

## 2019-08-11 NOTE — Progress Notes (Signed)
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  Pt states she works from now. Pt states she feels everything is going well and has no complaints.    24-Hr Dietary Recall First Meal: [place] sausage  Snack: -  Second Meal: [place] pepperoni + ham + cheese  Snack: -  Third Meal: chicken  Snack: - Beverages: water, Gatorade Zero   Estimated daily fluid intake: 64+ oz Estimated daily protein intake: 60 g Supplements: bariatric MVI, calcium Current average weekly physical activity: walking, light weights, gym 3 days a week   Post-Op Goals/ Signs/ Symptoms Using straws: sometimes Drinking while eating: no Chewing/swallowing difficulties: no Changes in vision: no Changes to mood/headaches: no Hair loss/changes to skin/nails: no Difficulty focusing/concentrating: no Sweating: no Dizziness/lightheadedness: no Palpitations: no  Carbonated/caffeinated beverages: no N/V/D/C/Gas: no Abdominal pain: no Dumping syndrome: no   NUTRITION DIAGNOSIS  Overweight/obesity ([place]-3.3) related  to past poor dietary habits and physical inactivity as evidenced by completed bariatric surgery and following dietary guidelines for continued weight loss and healthy nutrition status.   NUTRITION INTERVENTION Nutrition counseling (C-1) and education (E-2) to facilitate bariatric surgery goals, including: . Diet advancement to the next phase (phase 5) now including starchy vegetables  . The importance of consuming adequate calories as well as certain nutrients daily due to the body's need for essential vitamins, minerals, and fats . The importance of daily physical activity and to reach a goal of at least 150 minutes of moderate to vigorous physical activity weekly (or as directed by their physician) due to benefits such as increased musculature and improved lab values  Handouts Provided Include   Phase 5: Protein + Starchy Vegetables   Be sure to eat no starchy vegetables 2 times a day 7 daysa  Week and eat every 5 hours minimum  Learning Style & Readiness for Change Teaching method utilized: Visual & Auditory  Demonstrated degree of understanding via: Teach Back  Barriers to learning/adherence to lifestyle change: None Identified    MONITORING & EVALUATION Dietary intake, weekly physical activity, body weight  Next Steps Patient is to follow-up in 1 month

## 2019-10-28 ENCOUNTER — Encounter: Payer: 59 | Attending: Surgery | Admitting: Skilled Nursing Facility1

## 2019-10-28 ENCOUNTER — Other Ambulatory Visit: Payer: Self-pay

## 2019-10-28 DIAGNOSIS — E669 Obesity, unspecified: Secondary | ICD-10-CM | POA: Diagnosis not present

## 2019-10-28 NOTE — Progress Notes (Addendum)
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    NUTRITION ASSESSMENT  Anthropometrics  Start weight at [facility]: 414.8 lbs (date: 11/20/2018) Today's weight:  249.8  Body Composition Scale 02/09/2019 03/21/2018 10/28/2019  Weight  lbs 381.1 253.3 249.8  BMI 61.54 57.1 40.3  Total Body Fat  % 51.8 50.5 42.8     Visceral Fat 18 17 10   Fat-Free Mass  % 48.1 49.4 57.1     Total Body Water  % 38.5 39.2 43     Muscle-Mass  lbs 36.2 35.9 34.9  Body Fat Displacement --- ---          Torso  lbs 122.8 111 66.3         Left Leg  lbs 24.5 22.2 13.2         Right Leg  lbs 24.5 22.2 13.2         Left Arm  lbs 12.2 11.1 6.6         Right Arm  lbs 12.2 11.1 6.6   Lifestyle & Dietary Hx  Pt states she is switching jobs and will not have insurance. Pt states she has not included starchy vegetables. Pt states she has not eaten any carbohydrates due to a fear. Pt states she will eat fruit though. Pt states she is realizing she is constipated. Pt state she is really excited for her new job.  pts husband helps her be watchful of possible eating disorder behavior.     24-Hr Dietary Recall First Meal: sausage + eggs + 1/4 lavash + cheese Snack: -  Second Meal: [place] pepperoni + ham + cheese + salad Snack: -  Third Meal: chicken or lavash + cheese Snack: - Beverages: water, Gatorade Zero   Estimated daily fluid intake: 64+ oz Estimated daily protein intake: 60 g Supplements: bariatric MVI, calcium Current average weekly physical activity: walking, light weights, gym 3 days a week   Post-Op Goals/ Signs/ Symptoms Using straws: sometimes Drinking while eating:  no Chewing/swallowing difficulties: no Changes in vision: no Changes to mood/headaches: no Hair loss/changes to skin/nails: no Difficulty focusing/concentrating: no Sweating: no Dizziness/lightheadedness: no Palpitations: no  Carbonated/caffeinated beverages: no N/V/D/C/Gas: every 3 days hard Abdominal pain: no Dumping syndrome: no   NUTRITION DIAGNOSIS  Overweight/obesity ([place]-3.3) related to past poor dietary habits and physical inactivity as evidenced by completed bariatric surgery and following dietary guidelines for continued weight loss and healthy nutrition status.   NUTRITION INTERVENTION Nutrition counseling (C-1) and education (E-2) to facilitate bariatric surgery goals, including: . Diet advancement to the next phase (phase 5) now including starchy vegetables  . The importance of consuming adequate calories as well as certain nutrients daily due to the body's need for essential vitamins, minerals, and fats . The importance of daily physical activity and to reach a goal of at least 150 minutes of moderate to vigorous physical activity weekly (or as directed by their physician) due to benefits such as increased musculature and improved lab values  Handouts Provided Include   Phase 5: Protein + Starchy Vegetables   Be sure to eat no starchy vegetables 2 times a day 7 daysa  Week and eat every 5 hours minimum  Learning Style &  Readiness for Change Teaching method utilized: Visual & Auditory  Demonstrated degree of understanding via: Teach Back  Barriers to learning/adherence to lifestyle change: None Identified    MONITORING & EVALUATION Dietary intake, weekly physical activity, body weight  Next Steps Patient is to follow-up

## 2020-02-24 ENCOUNTER — Ambulatory Visit: Payer: 59 | Attending: Internal Medicine

## 2020-02-24 DIAGNOSIS — Z23 Encounter for immunization: Secondary | ICD-10-CM

## 2020-02-24 NOTE — Progress Notes (Signed)
   Covid-19 Vaccination Clinic  Name:  Tara Bush    MRN: [id number] DOB: 11-Jun-1990  02/24/2020  [name] was observed post Covid-19 immunization for 15 minutes without incident. She was provided with Vaccine Information Sheet and instruction to access the V-Safe system.   [name] was instructed to call 911 with any severe reactions post vaccine: Marland Kitchen Difficulty breathing  . Swelling of face and throat  . A fast heartbeat  . A bad rash all over body  . Dizziness and weakness   Immunizations Administered    Name Date Dose VIS Date Route   Pfizer COVID-19 Vaccine 02/24/2020  1:26 PM 0.3 mL 12/01/2019 Intramuscular   Manufacturer: [employer], [employer]   Lot: [id number]   NDC: [address]-1

## 2020-03-27 ENCOUNTER — Ambulatory Visit (INDEPENDENT_AMBULATORY_CARE_PROVIDER_SITE_OTHER): Payer: 59 | Admitting: Women's Health

## 2020-03-27 ENCOUNTER — Encounter: Payer: Self-pay | Admitting: Women's Health

## 2020-03-27 ENCOUNTER — Other Ambulatory Visit: Payer: Self-pay

## 2020-03-27 VITALS — BP 130/86 | HR 65 | Ht 65.0 in | Wt 218.0 lb

## 2020-03-27 DIAGNOSIS — Z319 Encounter for procreative management, unspecified: Secondary | ICD-10-CM

## 2020-03-27 NOTE — Progress Notes (Signed)
   GYN VISIT Patient name: Tara Bush MRN [id number]  Date of birth: Nov 22, 1990 Chief Complaint:   Contraception (Discuss family planning)  History of Present Illness:   Tara Bush is a 30 y.o. G0P0000 Caucasian female being seen today to discuss getting pregnant.  Had Roux-en-Y bypass in Dec 2020 and has lost 230lbs!  Had IUD removed Jan 2021, has had regular periods since, tracks them on app. She used to have irregular and heavy periods prior to weight loss. Got ok from bariatric MD to begin trying for pregnancy in Nov.  Last period was shorter, brown spotting x 1d, then heavy x 1d, then stopped the next. Neg HPTs. Not taking pnv.  Depression screen [facility] 2/9 11/20/2018 03/22/2016 03/30/2015 12/14/2014  Decreased Interest 0 3 0 0  Down, Depressed, Hopeless 0 2 0 0  PHQ - 2 Score 0 5 0 0  Altered sleeping - 2 - -  Tired, decreased energy - 3 - -  Change in appetite - 3 - -  Feeling bad or failure about yourself  - 1 - -  Trouble concentrating - 0 - -  Moving slowly or fidgety/restless - 0 - -  Suicidal thoughts - 0 - -  PHQ-9 Score - 14 - -  Difficult doing work/chores - Somewhat difficult - -    Patient's last menstrual period was 03/19/2020. The current method of family planning is none.  Last pap 2016. Results were: neg Review of Systems:   Pertinent items are noted in HPI Denies fever/chills, dizziness, headaches, visual disturbances, fatigue, shortness of breath, chest pain, abdominal pain, vomiting, abnormal vaginal discharge/itching/odor/irritation, problems with periods, bowel movements, urination, or intercourse unless otherwise stated above.  Pertinent History Reviewed:  Reviewed past medical,surgical, social, obstetrical and family history.  Reviewed problem list, medications and allergies. Physical Assessment:   Vitals:   03/27/20 1626  BP: 130/86  Pulse: 65  Weight: 218 lb (98.9 kg)  Height: 5\' 5"  (1.651 m)  Body mass index is 36.28 kg/m.       Physical  Examination:   General appearance: alert, well appearing, and in no distress  Mental status: alert, oriented to person, place, and time  Skin: warm & dry   Cardiovascular: normal heart rate noted  Respiratory: normal respiratory effort, no distress  Abdomen: soft, non-tender   Pelvic: examination not indicated  Extremities: no edema   Chaperone: N/A    No results found for this or any previous visit (from the past 24 hour(s)).  Assessment & Plan:  1) Desires pregnancy> discussed normal to take up to a year to conceive. Having regular periods, likely ovulating. Offered to check Day21 progesterone, wants to wait a little longer. If not pregnant by May ( of trying), let me know and will check then. Start pnv. Let know if gets +HPT.   2) Past due for pap> schedule today  Meds: No orders of the defined types were placed in this encounter.   No orders of the defined types were placed in this encounter.   Return for 1st available, Pap & physical.  [place] CNM, [facility] 03/27/2020 5:14 PM

## 2020-03-27 NOTE — Patient Instructions (Signed)
If you are trying to get pregnant:  °Have sex every other day on days 7-24 of your cycle (Day 1 is the 1st day of your period) °Pee before sex °Lay with your hips elevated on pillows for 20-30mins after sex °Do not smoke or drink alcohol °Lose weight if you are overweight °Take a prenatal vitamin with at least 400mcg of folic acid °Decrease stress in your life ° °For Him:  °Wear boxers instead of briefs °Avoid hot baths/jacuzzi °Vit C supplement °Do not smoke or drink alcohol °Lose weight if you are overweight ° °Keep in mind that it can be normal to take up to a year to become pregnant °80-90% of women will become pregnant within 1 year of trying ° ° °

## 2020-04-25 ENCOUNTER — Other Ambulatory Visit: Payer: 59 | Admitting: Obstetrics & Gynecology

## 2020-04-25 NOTE — Progress Notes (Deleted)
   WELL-WOMAN EXAMINATION Patient name: Tara Bush MRN [id number]  Date of birth: 01-04-1991 Chief Complaint:   No chief complaint on file.  History of Present Illness:   Tara Bush is a 30 y.o. G0P0000 {Race/ethnicity:17218} female being seen today for a routine well-woman exam.  Today she notes: ***   No LMP recorded. Denies issues with her menses The current method of family planning is {contraceptive method:5051}.    Last pap 2016.    Depression screen [facility] 2/9 11/20/2018 03/22/2016 03/30/2015 12/14/2014  Decreased Interest 0 3 0 0  Down, Depressed, Hopeless 0 2 0 0  PHQ - 2 Score 0 5 0 0  Altered sleeping - 2 - -  Tired, decreased energy - 3 - -  Change in appetite - 3 - -  Feeling bad or failure about yourself  - 1 - -  Trouble concentrating - 0 - -  Moving slowly or fidgety/restless - 0 - -  Suicidal thoughts - 0 - -  PHQ-9 Score - 14 - -  Difficult doing work/chores - Somewhat difficult - -      Review of Systems:   Pertinent items are noted in HPI Denies any headaches, blurred vision, fatigue, shortness of breath, chest pain, abdominal pain, bowel movements, urination, or intercourse unless otherwise stated above.  Pertinent History Reviewed:  Reviewed past medical,surgical, social and family history.  Reviewed problem list, medications and allergies. Physical Assessment:  There were no vitals filed for this visit.There is no height or weight on file to calculate BMI.        Physical Examination:   General appearance - well appearing, and in no distress  Mental status - alert, oriented to person, place, and time  Psych:  She has a normal mood and affect  Skin - warm and dry, normal color, no suspicious lesions noted  Chest - effort normal, all lung fields clear to auscultation bilaterally  Heart - normal rate and regular rhythm  Neck:  midline trachea, no thyromegaly or nodules  Breasts - breasts appear normal, no suspicious masses, no skin or  nipple changes or  axillary nodes  Abdomen - soft, nontender, nondistended, no masses or organomegaly  Pelvic - VULVA: normal appearing vulva with no masses, tenderness or lesions  VAGINA: normal appearing vagina with normal color and discharge, no lesions  CERVIX: normal appearing cervix without discharge or lesions, no CMT  Thin prep pap is {Desc; done/not:10129} *** HR HPV cotesting  UTERUS: uterus is felt to be normal size, shape, consistency and nontender   ADNEXA: No adnexal masses or tenderness noted.  Rectal - normal rectal, good sphincter tone, no masses felt. Hemoccult: ***  Extremities:  No swelling or varicosities noted  Chaperone: {Chaperone:19197::"N/A","Latisha Cresenzo","Janet Young","Amanda Andrews","Peggy Dones","Nicole Jones","Angel Neas"}     Assessment & Plan:  1) Well-Woman Exam ***  2) ***  No orders of the defined types were placed in this encounter.   Meds: No orders of the defined types were placed in this encounter.   Follow-up: No follow-ups on file.   Myna Hidalgo, DO Attending Obstetrician & Gynecologist, [facility] for [employer], [facility]

## 2020-05-10 ENCOUNTER — Other Ambulatory Visit: Payer: 59 | Admitting: Advanced Practice Midwife

## 2020-05-22 ENCOUNTER — Telehealth: Payer: Self-pay | Admitting: Adult Health

## 2020-05-22 NOTE — Telephone Encounter (Signed)
Pt thinks she may have had a miscarriage last week. She never had a positive pregnancy test. Had abd pain and passing clots but that has resolved now. Pt is not showing any signs of infection. Pt has an appt scheduled for 4/27 but pt was advised if anything changes, to please let us know. Pt voiced understanding. JSY

## 2020-05-22 NOTE — Telephone Encounter (Signed)
Pt states she possibly had a miscarriage last week & called nurse line & was told to follow up here Pt wonders if she should be seen or what should she do  Had bad abd pain & clots (lasted for 3 days & symptoms have resolved) - could have just been her period but she doesn't normally have pain like that with her cycle  Please advise & notify pt

## 2020-06-07 ENCOUNTER — Ambulatory Visit (INDEPENDENT_AMBULATORY_CARE_PROVIDER_SITE_OTHER): Payer: 59 | Admitting: Advanced Practice Midwife

## 2020-06-07 ENCOUNTER — Other Ambulatory Visit: Payer: Self-pay

## 2020-06-07 ENCOUNTER — Encounter: Payer: Self-pay | Admitting: Advanced Practice Midwife

## 2020-06-07 ENCOUNTER — Other Ambulatory Visit (HOSPITAL_COMMUNITY)
Admission: RE | Admit: 2020-06-07 | Discharge: 2020-06-07 | Disposition: A | Discharge status: Home / Self Care | Payer: 59 | Source: Ambulatory Visit | Attending: Obstetrics & Gynecology | Admitting: Obstetrics & Gynecology

## 2020-06-07 VITALS — BP 130/79 | HR 67 | Ht 66.0 in | Wt 221.0 lb

## 2020-06-07 DIAGNOSIS — Z01419 Encounter for gynecological examination (general) (routine) without abnormal findings: Secondary | ICD-10-CM | POA: Diagnosis not present

## 2020-06-07 NOTE — Progress Notes (Signed)
WELL-WOMAN EXAMINATION Patient name: Tara Bush MRN [id number]  Date of birth: [birth year] Chief Complaint:   Gynecologic Exam  History of Present Illness:   Tara Bush is a 30 y.o. G0P0000 Caucasian female being seen today for a routine well-woman exam.  Current complaints: last cycle was 1-2wks late, and when it started she had 1-2days of heavy bleeding w clots but overall cycle length was nl> no UPT but is concerned she may have had a SAB; had been TTC but is now planning on brachioplasty, etc s/p weight loss from Roux-en-Y (12/20, with 230lb weight loss) which will be performed in multiple procedures over the summer hopefully, so will hold off on TTC for the next months; declines contraception and will use NFP/condoms  Depression screen [facility] 2/9 06/07/2020 11/20/2018 03/22/2016 03/30/2015 12/14/2014  Decreased Interest 1 0 3 0 0  Down, Depressed, Hopeless 0 0 2 0 0  PHQ - 2 Score 1 0 5 0 0  Altered sleeping 1 - 2 - -  Tired, decreased energy 1 - 3 - -  Change in appetite 0 - 3 - -  Feeling bad or failure about yourself  1 - 1 - -  Trouble concentrating 0 - 0 - -  Moving slowly or fidgety/restless 0 - 0 - -  Suicidal thoughts 0 - 0 - -  PHQ-9 Score 4 - 14 - -  Difficult doing work/chores - - Somewhat difficult - -     PCP: Deboraha Sprang      does not desire labs Patient's last menstrual period was 05/22/2020 (approximate). The current method of family planning is NFP/condoms.  Last pap 2016. Results were: NILM w/ HRHPV not done. H/O abnormal pap: no Last mammogram: never. Family h/o breast cancer: no Last colonoscopy: never. Family h/o colorectal cancer: no Review of Systems:   Pertinent items are noted in HPI Denies any headaches, blurred vision, fatigue, shortness of breath, chest pain, abdominal pain, abnormal vaginal discharge/itching/odor/irritation, problems with periods, bowel movements, urination, or intercourse unless otherwise stated above. Pertinent History Reviewed:   Reviewed past medical,surgical, social and family history.  Reviewed problem list, medications and allergies. Physical Assessment:   Vitals:   06/07/20 0857  BP: 130/79  Pulse: 67  Weight: 221 lb (100.2 kg)  Height: 5\' 6"  (1.676 m)  Body mass index is 35.67 kg/m.        Physical Examination:   General appearance - well appearing, and in no distress  Mental status - alert, oriented to person, place, and time  Psych:  She has a normal mood and affect  Skin - warm and dry, normal color, no suspicious lesions noted  Chest - effort normal, all lung fields clear to auscultation bilaterally  Heart - normal rate and regular rhythm  Neck:  midline trachea, no thyromegaly or nodules  Breasts - breasts appear normal, no suspicious masses, no skin or nipple changes or  axillary nodes  Abdomen - soft, nontender, nondistended, no masses or organomegaly  Pelvic - VULVA: normal appearing vulva with no masses, tenderness or lesions  VAGINA: normal appearing vagina with normal color and discharge, no lesions  CERVIX: normal appearing cervix without discharge or lesions, no CMT  Thin prep pap is done with HR HPV cotesting  UTERUS: uterus is felt to be normal size, shape, consistency and nontender   ADNEXA: No adnexal masses or tenderness noted.  Extremities:  No swelling or varicosities noted  Chaperone: Angel Neas    No results found for  this or any previous visit (from the past 24 hour(s)).  Assessment & Plan:  1) Well-Woman Exam  2) Planning post-weight loss procedures for loose skin, will hold off on TTC over the summer  Labs/procedures today: Pap w cultures  Mammogram: @ 28yo, or sooner if problems Colonoscopy: @ 28yo, or sooner if problems  No orders of the defined types were placed in this encounter.   Meds: No orders of the defined types were placed in this encounter.   Follow-up: Return in about 1 year (around 06/07/2021) for Physical.  Arabella Merles [facility] 06/10/2020 12:03  PM

## 2020-06-12 ENCOUNTER — Telehealth: Payer: Self-pay | Admitting: Adult Health

## 2020-06-12 ENCOUNTER — Encounter: Payer: Self-pay | Admitting: Adult Health

## 2020-06-12 DIAGNOSIS — R87810 Cervical high risk human papillomavirus (HPV) DNA test positive: Secondary | ICD-10-CM

## 2020-06-12 HISTORY — DX: Cervical high risk human papillomavirus (HPV) DNA test positive: R87.810

## 2020-06-12 LAB — CYTOLOGY - PAP
Chlamydia: NEGATIVE
Comment: NEGATIVE
Comment: NEGATIVE
Comment: NEGATIVE
Comment: NORMAL
Diagnosis: NEGATIVE
HPV 16: NEGATIVE
HPV 18 / 45: NEGATIVE
High risk HPV: POSITIVE — AB
Neisseria Gonorrhea: NEGATIVE

## 2020-06-12 NOTE — Telephone Encounter (Signed)
Pt informed to repeat pap in 1 year per ASCCP guidelines

## 2020-06-12 NOTE — Telephone Encounter (Signed)
Pt saw her PAP results in MyChart & is worried, would like to know what the next step is  Please advise & call pt

## 2020-06-13 ENCOUNTER — Telehealth: Payer: Self-pay

## 2020-06-13 NOTE — Telephone Encounter (Signed)
Pt has questions about her lab results and would like a call back

## 2020-06-20 ENCOUNTER — Other Ambulatory Visit: Payer: Self-pay

## 2020-06-20 ENCOUNTER — Ambulatory Visit (INDEPENDENT_AMBULATORY_CARE_PROVIDER_SITE_OTHER): Payer: 59 | Admitting: Plastic Surgery

## 2020-06-20 ENCOUNTER — Encounter: Payer: Self-pay | Admitting: Plastic Surgery

## 2020-06-20 DIAGNOSIS — E881 Lipodystrophy, not elsewhere classified: Secondary | ICD-10-CM

## 2020-06-20 DIAGNOSIS — Z719 Counseling, unspecified: Secondary | ICD-10-CM | POA: Diagnosis not present

## 2020-06-20 NOTE — Progress Notes (Addendum)
Patient ID: Tara Bush, female    DOB: [birth year], 30 y.o.   MRN: [id number]   Chief Complaint  Patient presents with  . consult    The patient is a 30 year old female here for evaluation for several areas.  Her main focus today or her arms.  But overall she is interested in brachioplasty, abdominoplasty, breast reduction and a thigh lift.  She would like to start with a brachioplasty.  She is 5 feet 6 inches tall weighs 219 pounds.  Her original weight several years ago was 430 pounds.  In December 2020 she underwent gastric bypass and has been able to keep her weight down at her current amount since then.  Her mom has MS and has had some serious effects from it and so it has motivated her to become more healthy.  She does get rashes in her axilla and in her groin area.  She is not a smoker and does not have diabetes.  She is not on any blood thinners.  She has frequent skin chafing and rashes in the axilla and inner arm area.  Her previous surgery was a laparoscopic Roux-en-Y by Dr. Daphine Deutscher.  She is otherwise in good health.   Review of Systems  Constitutional: Negative for activity change and appetite change.  Eyes: Negative.   Respiratory: Negative.  Negative for shortness of breath.   Cardiovascular: Negative for leg swelling.  Gastrointestinal: Positive for abdominal distention. Negative for abdominal pain.  Endocrine: Negative.   Genitourinary: Negative.   Skin: Positive for color change and rash.  Neurological: Negative.   Hematological: Negative.   Psychiatric/Behavioral: Negative.     Past Medical History:  Diagnosis Date  . Dysfunctional uterine bleeding   . Hypertension    started lisinopril 1/16  . Obesity   . Papanicolaou smear of cervix with positive high risk human papilloma virus (HPV) test 06/12/2020   06/12/2020 repeat pap in 1 year 5 year risk CIN 3+ 4.8%  . Post traumatic stress disorder (PTSD)     Past Surgical History:  Procedure Laterality Date  .  GASTRIC ROUX-EN-Y N/A 01/25/2019   Procedure: LAPAROSCOPIC ROUX-EN-Y GASTRIC BYPASS WITH UPPER ENDOSCOPY, ERAS Pathway;  Surgeon: Luretha Murphy, MD;  Location: [facility];  Service: General;  Laterality: N/A;  . TONSILLECTOMY AND ADENOIDECTOMY        Current Outpatient Medications:  .  acetaminophen (TYLENOL) 500 MG tablet, Take 1,000 mg by mouth every 6 (six) hours as needed for moderate pain or headache., Disp: , Rfl:  .  calcium carbonate (OS-CAL - DOSED IN MG OF ELEMENTAL CALCIUM) 1250 (500 Ca) MG tablet, Take 1 tablet by mouth 3 (three) times daily., Disp: , Rfl:  .  Multiple Vitamins-Minerals (BARIATRIC FUSION) CHEW, Chew 2 tablets by mouth daily with breakfast., Disp: , Rfl:  .  ondansetron (ZOFRAN-ODT) 4 MG disintegrating tablet, Take 1 tablet (4 mg total) by mouth every 6 (six) hours as needed for nausea or vomiting., Disp: 20 tablet, Rfl: 0   Objective:   Vitals:   06/20/20 1452  BP: 130/83  Pulse: 61  SpO2: 98%    Physical Exam Vitals and nursing note reviewed.  Constitutional:      Appearance: Normal appearance.  HENT:     Head: Normocephalic and atraumatic.  Cardiovascular:     Rate and Rhythm: Normal rate.     Pulses: Normal pulses.  Pulmonary:     Effort: Pulmonary effort is normal. No respiratory distress.  Breath sounds: No wheezing.  Abdominal:     General: Abdomen is flat. There is no distension.     Palpations: There is no mass.     Tenderness: There is no abdominal tenderness.  Skin:    General: Skin is warm.     Coloration: Skin is not jaundiced.     Findings: Rash present. No bruising.  Neurological:     General: No focal deficit present.     Mental Status: She is alert and oriented to person, place, and time.  Psychiatric:        Mood and Affect: Mood normal.        Behavior: Behavior normal.     Assessment & Plan:  Encounter for counseling  Lipodystrophy  The patient is a good candidate for brachioplasty.  She also has excess tissue in  her axilla.  This may need to be taken care of when she has a mastopexy because of its location beyond the axillary fold.  Due to the repeat skin breakdown and rashes I think she is a very good candidate to have this excess tissue excised.  Pictures were obtained of the patient and placed in the chart with the patient's or guardian's permission.   Alena Bills Renley Gutman, DO

## 2020-06-27 DIAGNOSIS — E881 Lipodystrophy, not elsewhere classified: Secondary | ICD-10-CM | POA: Insufficient documentation

## 2020-07-18 ENCOUNTER — Institutional Professional Consult (permissible substitution): Payer: 59 | Admitting: Plastic Surgery

## 2020-07-28 ENCOUNTER — Telehealth: Payer: Self-pay | Admitting: Adult Health

## 2020-07-28 NOTE — Telephone Encounter (Signed)
Pt would like a call back from a nurse today She sent a detailed MyChart message, nurse advised on calling to schedule appointment, we have pt scheduled for 6/20 8:30am w/Dr. Charlotta Newton  Pt requesting a call back so that the nurse can give an opinion on what her pain could be so she won't be in "freak out" mode all weekend  Please call pt

## 2020-07-28 NOTE — Telephone Encounter (Signed)
Has had pain right hip low back pain, no fever,nausea,vomiting or diarrhea, worse in am, not keeping her from ADL, has appt Monday if increases in severity go to Urgent Care . Try sleeping with pillow between knees

## 2020-07-31 ENCOUNTER — Ambulatory Visit (INDEPENDENT_AMBULATORY_CARE_PROVIDER_SITE_OTHER): Payer: 59 | Admitting: Obstetrics & Gynecology

## 2020-07-31 ENCOUNTER — Other Ambulatory Visit: Payer: Self-pay

## 2020-07-31 ENCOUNTER — Encounter: Payer: Self-pay | Admitting: Obstetrics & Gynecology

## 2020-07-31 VITALS — BP 127/78 | HR 69 | Ht 66.0 in | Wt 222.0 lb

## 2020-07-31 DIAGNOSIS — F419 Anxiety disorder, unspecified: Secondary | ICD-10-CM

## 2020-07-31 DIAGNOSIS — R87820 Cervical low risk human papillomavirus (HPV) DNA test positive: Secondary | ICD-10-CM

## 2020-07-31 DIAGNOSIS — R102 Pelvic and perineal pain: Secondary | ICD-10-CM

## 2020-07-31 MED ORDER — SERTRALINE HCL 50 MG PO TABS: 50.0000 mg | ORAL_TABLET | Freq: Every day | ORAL | 1 refills | Status: DC

## 2020-07-31 NOTE — Progress Notes (Signed)
GYN VISIT Patient name: Tara Bush MRN [id number]  Date of birth: 10-Jun-1990 Chief Complaint:   Pelvic Pain (? Hip pain)  History of Present Illness:   Tara Bush is a 30 y.o. G0P0000 female being seen today for the following concerns:   Pelvic pain: Initially she presented today due to concerns for pelvic pain- called in to office.  She wasn't sure if it was the way she was sleeping or something else.  It all started after she got the result of her recent pap smear.  Notes pain on right that radiated.  Tried tylenol with some improvement.  When used pillow b/w legs with sleeping also had improvement.  Notes minimal pain currently.  Denies irregular bleeding or discharge.  Anxiety: In describing what was going on, she reported an "internal melt down".  Anxiety so intense, husband had to come home from work.  Very concerned about potential risk of cancer.  Feels like she is super concerned about every little thing.  Notes "rolling depression" that she tries to deal with on her own. Previously on anxiety medication and therapy; however she stopped.  Notes h/o PTSD from childhood trauma.  Last pap: 05/2020- Pap neg, HPV +, 16/18 neg, []  repeat pap 05/2021 Contraception: condoms/tracking menses.     Patient's last menstrual period was 07/14/2020.  Depression screen [facility] 2/9 06/07/2020 11/20/2018 03/22/2016 03/30/2015 12/14/2014  Decreased Interest 1 0 3 0 0  Down, Depressed, Hopeless 0 0 2 0 0  PHQ - 2 Score 1 0 5 0 0  Altered sleeping 1 - 2 - -  Tired, decreased energy 1 - 3 - -  Change in appetite 0 - 3 - -  Feeling bad or failure about yourself  1 - 1 - -  Trouble concentrating 0 - 0 - -  Moving slowly or fidgety/restless 0 - 0 - -  Suicidal thoughts 0 - 0 - -  PHQ-9 Score 4 - 14 - -  Difficult doing work/chores - - Somewhat difficult - -     Review of Systems:   Pertinent items are noted in HPI Denies fever/chills, dizziness, headaches, visual disturbances, fatigue,  shortness of breath, chest pain, abdominal pain, vomiting, problems with periods, bowel movements, urination, or intercourse unless otherwise stated above.  Pertinent History Reviewed:  Reviewed past medical,surgical, social, obstetrical and family history.  Reviewed problem list, medications and allergies. Physical Assessment:   Vitals:   07/31/20 0833  BP: 127/78  Pulse: 69  Weight: 222 lb (100.7 kg)  Height: 5\' 6"  (1.676 m)  Body mass index is 35.83 kg/m.       Physical Examination:   General appearance: alert, well appearing, and in no distress  Psych: mood appropriate, normal affect  Skin: warm & dry   Cardiovascular: normal heart rate noted  Respiratory: normal respiratory effort, no distress  Abdomen: soft, non-tender, no reproducible pain  Pelvic: normal external genitalia, vulva, vagina, cervix, uterus and adnexa, VULVA: normal appearing vulva with no masses, tenderness or lesions, VAGINA: normal appearing vagina with normal color and discharge, no lesions, CERVIX: normal appearing cervix without discharge or lesions, UTERUS: uterus is normal size, shape, consistency and nontender, ADNEXA: normal adnexa in size, nontender and no masses  Extremities: no edema   Chaperone: 08/02/20    Assessment & Plan:  1) Anxiety -reviewed conservative management, encouraged pt to restart counseling -discussed medical management- zoloft 50mg  to start, f/u in 6-8wks may increase to 100mg  daily  2) HPV -reviewed  pap results and reassured pt of findings -most important for patient to continue follow up  3) Pelvic pain -no abnormalities noted on exam -reviewed conservative therapy   No orders of the defined types were placed in this encounter.   Return in about 6 weeks (around 09/11/2020) for ok for televisit- 6-8wk med follow up.   Myna Hidalgo, DO Attending Obstetrician & Gynecologist, [facility] for [employer], [facility]

## 2020-09-25 ENCOUNTER — Telehealth (INDEPENDENT_AMBULATORY_CARE_PROVIDER_SITE_OTHER): Payer: 59 | Admitting: Obstetrics & Gynecology

## 2020-09-25 ENCOUNTER — Encounter: Payer: Self-pay | Admitting: Obstetrics & Gynecology

## 2020-09-25 DIAGNOSIS — F419 Anxiety disorder, unspecified: Secondary | ICD-10-CM

## 2020-09-25 NOTE — Progress Notes (Signed)
TELEHEALTH VIRTUAL GYN VISIT ENCOUNTER NOTE Patient name: Tara Bush MRN [id number]  Date of birth: 1990-10-21  I connected with patient on 09/25/20 at  8:30 AM EDT by video and verified that I am speaking with the correct person using two identifiers.  Pt is not currently in the office, she is at work.  Provider is in the office.    I discussed the limitations, risks, security and privacy concerns of performing an evaluation and management service by telephone and the availability of in person appointments. I also discussed with the patient that there may be a patient responsible charge related to this service. The patient expressed understanding and agreed to proceed.   Chief Complaint:   Follow-up  History of Present Illness:   Tara Bush is a 30 y.o. G0P0000  female being evaluated today for medication follow up.  Anxiety: At her last visit, we had determined that her anxiety was significantly impacting her day to day.  Started on zoloft 50mg  daily.  Today she notes that she is doing much better.  After her last visit, she felt relieved to know that she wasn't dying and feels like the medication is helping to prevent her spiraling thoughts.  She has a lot going on right now as she quit her old job, has 3 more days and is starting a new job, which will allow for better work/life balance.   Denies SI/HI.  Mood improved.  No acute complaints  Depression screen [facility] 2/9 09/25/2020 06/07/2020 11/20/2018 03/22/2016 03/30/2015  Decreased Interest 0 1 0 3 0  Down, Depressed, Hopeless 0 0 0 2 0  PHQ - 2 Score 0 1 0 5 0  Altered sleeping 1 1 - 2 -  Tired, decreased energy 0 1 - 3 -  Change in appetite 0 0 - 3 -  Feeling bad or failure about yourself  0 1 - 1 -  Trouble concentrating 0 0 - 0 -  Moving slowly or fidgety/restless 0 0 - 0 -  Suicidal thoughts 0 0 - 0 -  PHQ-9 Score 1 4 - 14 -  Difficult doing work/chores - - - Somewhat difficult -    No LMP recorded. The current  method of family planning is rhythm method. May consider a pregnancy in the near future Last pap 05/2020. Results were:  NILM w/ HRHPV positive: other (not 16, 18/45) Review of Systems:   Pertinent items are noted in HPI Denies fever/chills, dizziness, headaches, visual disturbances, fatigue, shortness of breath, chest pain, abdominal pain, vomiting, abnormal vaginal discharge/itching/odor/irritation, problems with periods, bowel movements, urination, or intercourse unless otherwise stated above.  Pertinent History Reviewed:  Reviewed past medical,surgical, social, obstetrical and family history.  Reviewed problem list, medications and allergies. Physical Assessment:  There were no vitals filed for this visit.There is no height or weight on file to calculate BMI.       Physical Examination:   General:  Alert, oriented and cooperative.   Mental Status: Normal mood and affect perceived. Normal judgment and thought content.  Physical exam deferred due to nature of the encounter  No results found for this or any previous visit (from the past 24 hour(s)).  Assessment & Plan:  1) Anxiety -plan to continue with zoloft 50mg  daily  2) Family planning -menses regular each month -PNV daily once actively trying -F/u prn or 1yr for annual  Meds: No orders of the defined types were placed in this encounter.   No orders of the  defined types were placed in this encounter.   I discussed the assessment and treatment plan with the patient. The patient was provided an opportunity to ask questions and all were answered. The patient agreed with the plan and demonstrated an understanding of the instructions.   The patient was advised to call back or seek an in-person evaluation/go to the ED if the symptoms worsen or if the condition fails to improve as anticipated.  I provided 15 minutes of non-face-to-face time during this encounter.   Return in about 1 year (around 09/25/2021), or if symptoms worsen  or fail to improve, for Annual.  Myna Hidalgo, DO Attending Obstetrician & Gynecologist, [facility] for [facility], [facility]

## 2020-12-14 DIAGNOSIS — Z0000 Encounter for general adult medical examination without abnormal findings: Secondary | ICD-10-CM | POA: Diagnosis not present

## 2020-12-14 DIAGNOSIS — Z13220 Encounter for screening for lipoid disorders: Secondary | ICD-10-CM | POA: Diagnosis not present

## 2020-12-14 DIAGNOSIS — Z131 Encounter for screening for diabetes mellitus: Secondary | ICD-10-CM | POA: Diagnosis not present

## 2020-12-14 DIAGNOSIS — Z23 Encounter for immunization: Secondary | ICD-10-CM | POA: Diagnosis not present

## 2020-12-28 DIAGNOSIS — F4310 Post-traumatic stress disorder, unspecified: Secondary | ICD-10-CM | POA: Diagnosis not present

## 2021-01-10 DIAGNOSIS — F4310 Post-traumatic stress disorder, unspecified: Secondary | ICD-10-CM | POA: Diagnosis not present

## 2021-01-12 ENCOUNTER — Telehealth: Payer: BC Managed Care – PPO | Admitting: Obstetrics & Gynecology

## 2021-01-12 ENCOUNTER — Encounter: Payer: Self-pay | Admitting: Obstetrics & Gynecology

## 2021-01-12 DIAGNOSIS — Z3169 Encounter for other general counseling and advice on procreation: Secondary | ICD-10-CM | POA: Diagnosis not present

## 2021-01-12 NOTE — Progress Notes (Signed)
   TELEHEALTH GYNECOLOGY VISIT ENCOUNTER NOTE  [facility]   Patient location: Home  I connected with Tara Bush on 01/12/21 at 10:50 AM EST by telephone and verified that I am speaking with the correct person using two identifiers. Patient was unable to do MyChart audiovisual encounter due to technical difficulties, she tried several times.    I discussed the limitations, risks, security and privacy concerns of performing an evaluation and management service by telephone and the availability of in person appointments. I also discussed with the patient that there may be a patient responsible charge related to this service. The patient expressed understanding and agreed to proceed.   History:  Tara Bush is a 30 y.o. G0P0000 female being evaluated today for the following concerns:   -Anxiety- At her last visit- she was started on zoloft 50mg .  Today she notes that this dose is still doing well for her.  She has also started therapy, 2nd session today.  -Future pregnancy: She notes that she and her husband have been actively trying for about a year without success.  Menses regular each month.  Using app to track and she does note when she has ovulation, she can tell the difference.  Pt does have h/o PCOS and prior bariatric surgery.  She denies any abnormal vaginal discharge, bleeding, pelvic pain or other concerns.       Past Medical History:  Diagnosis Date   Dysfunctional uterine bleeding    Hypertension    started lisinopril 1/16   Obesity    Papanicolaou smear of cervix with positive high risk human papilloma virus (HPV) test 06/12/2020   06/12/2020 repeat pap in 1 year 5 year risk CIN 3+ 4.8%   Post traumatic stress disorder (PTSD)    Past Surgical History:  Procedure Laterality Date   GASTRIC ROUX-EN-Y N/A 01/25/2019   Procedure: LAPAROSCOPIC ROUX-EN-Y GASTRIC BYPASS WITH UPPER ENDOSCOPY, ERAS Pathway;  Surgeon:  01/27/2019, MD;  Location: [facility];  Service: General;  Laterality: N/A;   TONSILLECTOMY AND ADENOIDECTOMY     The following portions of the patient's history were reviewed and updated as appropriate: allergies, current medications, past family history, past medical history, past social history, past surgical history and problem list.    Review of Systems:  Pertinent items noted in HPI and remainder of comprehensive ROS otherwise negative.  Physical Exam:   General:  Alert, oriented and cooperative.   Mental Status: Normal mood and affect perceived. Normal judgment and thought content.  Physical exam deferred due to nature of the encounter   Assessment and Plan:     -Infertility, h/o PCOS     I discussed the assessment and treatment plan with the patient.   -Reviewed infertility and fecundity -Plan for basic work up including the following:  Semen analysis  AMH level, TSH, A1c level, Prolactin  HSG- referral sent to be completed -Discussed each of the above tests.  The patient was provided an opportunity to ask questions and all were answered. The patient agreed with the plan and demonstrated an understanding of the instructions. -Further management pending results.  Discussed concern for possible referral to REI for further evaluation/management.   I provided 20 minutes of non-face-to-face time during this encounter.   Luretha Murphy, DO Center for [facility], [facility]

## 2021-01-16 ENCOUNTER — Telehealth: Payer: Self-pay

## 2021-01-16 NOTE — Telephone Encounter (Signed)
Called pt to clarify information and find out when to call husband. Called husband and got insurance information. Tara Bush is going to take a picture of the front and back of his insurance card for the semen analysis form and send it to [employer]. She will upload those pictures to Mychart so he doesn't have to come into the office since he's a truck driver.

## 2021-01-16 NOTE — Telephone Encounter (Signed)
-----   Message from Myna Hidalgo, DO sent at 01/12/2021 11:22 AM EST ----- Discussed infertility work up- plan for her husband to complete semen analysis.  Please complete form.  He's information:  Dajsha Massaro DOB: [birth year]04/1988 Phone #: [phone]  Thanks!

## 2021-01-23 ENCOUNTER — Other Ambulatory Visit: Payer: Self-pay | Admitting: *Deleted

## 2021-01-23 DIAGNOSIS — Z3169 Encounter for other general counseling and advice on procreation: Secondary | ICD-10-CM

## 2021-01-25 ENCOUNTER — Telehealth: Payer: Self-pay

## 2021-01-25 DIAGNOSIS — F4310 Post-traumatic stress disorder, unspecified: Secondary | ICD-10-CM | POA: Diagnosis not present

## 2021-01-30 ENCOUNTER — Other Ambulatory Visit: Payer: Self-pay | Admitting: *Deleted

## 2021-01-30 DIAGNOSIS — N979 Female infertility, unspecified: Secondary | ICD-10-CM

## 2021-02-07 DIAGNOSIS — F4310 Post-traumatic stress disorder, unspecified: Secondary | ICD-10-CM | POA: Diagnosis not present

## 2021-02-21 ENCOUNTER — Other Ambulatory Visit: Payer: Self-pay | Admitting: *Deleted

## 2021-02-21 DIAGNOSIS — F419 Anxiety disorder, unspecified: Secondary | ICD-10-CM

## 2021-02-21 DIAGNOSIS — F4310 Post-traumatic stress disorder, unspecified: Secondary | ICD-10-CM | POA: Diagnosis not present

## 2021-02-21 MED ORDER — SERTRALINE HCL 50 MG PO TABS: 50.0000 mg | ORAL_TABLET | Freq: Every day | ORAL | 1 refills | Status: DC

## 2021-03-19 ENCOUNTER — Other Ambulatory Visit: Payer: Self-pay | Admitting: Obstetrics & Gynecology

## 2021-03-19 DIAGNOSIS — N979 Female infertility, unspecified: Secondary | ICD-10-CM

## 2021-03-22 ENCOUNTER — Other Ambulatory Visit: Payer: Self-pay

## 2021-03-22 ENCOUNTER — Encounter: Payer: Self-pay | Admitting: Obstetrics & Gynecology

## 2021-03-22 ENCOUNTER — Ambulatory Visit
Admission: RE | Admit: 2021-03-22 | Discharge: 2021-03-22 | Disposition: A | Discharge status: Home / Self Care | Payer: BC Managed Care – PPO | Source: Ambulatory Visit | Attending: Obstetrics & Gynecology | Admitting: Obstetrics & Gynecology

## 2021-03-22 ENCOUNTER — Other Ambulatory Visit: Payer: Self-pay | Admitting: Obstetrics & Gynecology

## 2021-03-22 DIAGNOSIS — N979 Female infertility, unspecified: Secondary | ICD-10-CM | POA: Diagnosis not present

## 2021-04-18 DIAGNOSIS — F4310 Post-traumatic stress disorder, unspecified: Secondary | ICD-10-CM | POA: Diagnosis not present

## 2021-05-30 DIAGNOSIS — F4310 Post-traumatic stress disorder, unspecified: Secondary | ICD-10-CM | POA: Diagnosis not present

## 2021-06-08 ENCOUNTER — Encounter: Payer: Self-pay | Admitting: Obstetrics & Gynecology

## 2021-06-13 ENCOUNTER — Other Ambulatory Visit: Payer: BC Managed Care – PPO | Admitting: Obstetrics & Gynecology

## 2021-06-25 ENCOUNTER — Encounter: Payer: Self-pay | Admitting: Obstetrics & Gynecology

## 2021-06-25 ENCOUNTER — Ambulatory Visit (INDEPENDENT_AMBULATORY_CARE_PROVIDER_SITE_OTHER): Payer: BC Managed Care – PPO | Admitting: Obstetrics & Gynecology

## 2021-06-25 ENCOUNTER — Other Ambulatory Visit (HOSPITAL_COMMUNITY)
Admission: RE | Admit: 2021-06-25 | Discharge: 2021-06-25 | Disposition: A | Discharge status: Home / Self Care | Payer: BC Managed Care – PPO | Source: Ambulatory Visit | Attending: Obstetrics & Gynecology | Admitting: Obstetrics & Gynecology

## 2021-06-25 VITALS — BP 125/74 | HR 70 | Ht 66.0 in | Wt 236.0 lb

## 2021-06-25 DIAGNOSIS — Z01419 Encounter for gynecological examination (general) (routine) without abnormal findings: Secondary | ICD-10-CM | POA: Diagnosis not present

## 2021-06-25 DIAGNOSIS — F419 Anxiety disorder, unspecified: Secondary | ICD-10-CM

## 2021-06-25 MED ORDER — SERTRALINE HCL 50 MG PO TABS: 50.0000 mg | ORAL_TABLET | Freq: Every day | ORAL | 3 refills | Status: DC

## 2021-06-25 NOTE — Progress Notes (Signed)
? ?WELL-WOMAN EXAMINATION ?Patient name: Tara Bush MRN [id number]  Date of birth: 07/16/1990 ?Chief Complaint:   ?Annual Exam ? ?History of Present Illness:   ?Tara Bush is a 31 y.o. G0P0000  female being seen today for a routine well-woman exam and the following concerns: ? ?Fertility/family planning: Initially, they were actively trying for over a year without success.  They had started the work up process (imaging below); however, they are reconsidering.  They have decided both want to focus on their career.  They are no longer wanting children and partner may consider a vasectomy ? ?03/22/2021- HSG completed- no abnormalities noted.  Normal endometrial cavity and patent fallopian tubes ? ?Menses mostly regular- denies HMB or dysmenorrhea. ? ?Anxiety: Doing well with zoloft 50mg  daily and still in therapy. ? ?Overall doing well and reports no acute complaints or concerns ? ? ?Patient's last menstrual period was 06/07/2021. ?Denies issues with her menses ?The current method of family planning is none.  ? ? ?Last pap 05/2020- Pap neg, HPV pos.  ?Last mammogram: n/a. ?Last colonoscopy: n/a ? ? ?  06/25/2021  ?  8:41 AM 01/12/2021  ? 11:02 AM 09/25/2020  ?  8:35 AM 06/07/2020  ?  8:49 AM 11/20/2018  ? 11:33 AM  ?Depression screen PHQ 2/9  ?Decreased Interest 0 0 0 1 0  ?Down, Depressed, Hopeless 0 0 0 0 0  ?PHQ - 2 Score 0 0 0 1 0  ?Altered sleeping 0 0 1 1   ?Tired, decreased energy 1 1 0 1   ?Change in appetite 1 0 0 0   ?Feeling bad or failure about yourself  0 0 0 1   ?Trouble concentrating 0 0 0 0   ?Moving slowly or fidgety/restless 0 0 0 0   ?Suicidal thoughts 0 0 0 0   ?PHQ-9 Score 2 1 1 4    ? ? ? ? ?Review of Systems:   ?Pertinent items are noted in HPI ?Denies any headaches, blurred vision, fatigue, shortness of breath, chest pain, abdominal pain, bowel movements, urination, or intercourse unless otherwise stated above. ? ?Pertinent History Reviewed:  ?Reviewed past medical,surgical, social and  family history.  ?Reviewed problem list, medications and allergies. ?Physical Assessment:  ? ?Vitals:  ? 06/25/21 0838  ?BP: 125/74  ?Pulse: 70  ?Weight: 236 lb (107 kg)  ?Height: 5\' 6"  (1.676 m)  ?Body mass index is 38.09 kg/m?. ?  ?     Physical Examination:  ? General appearance - well appearing, and in no distress ? Mental status - alert, oriented to person, place, and time ? Psych:  She has a normal mood and affect ? Skin - warm and dry, normal color, no suspicious lesions noted ? Chest - effort normal, all lung fields clear to auscultation bilaterally ? Heart - normal rate and regular rhythm ? Neck:  midline trachea, no thyromegaly or nodules ? Breasts - breasts appear normal, no suspicious masses, no skin or nipple changes or  axillary nodes ? Abdomen - soft, nontender, nondistended, no masses or organomegaly ? Pelvic - VULVA: normal appearing vulva with no masses, tenderness or lesions  VAGINA: normal appearing vagina with normal color and discharge, no lesions  CERVIX: normal appearing cervix without discharge or lesions, no CMT ? Thin prep pap is done with HR HPV cotesting ? UTERUS: uterus is felt to be normal size, shape, consistency and nontender  ? ADNEXA: No adnexal masses or tenderness noted. ? Extremities:  No swelling or varicosities  noted ? ?Chaperone:  pt declined    ? ? ?Assessment & Plan:  ?1) Well-Woman Exam ?-pap collected due to prior HPV, reviewed screening guidelines ? ?2) Anxiety ?-doing well with zoloft, plan to continue ? ?3) Family planning, Infertility ?-no intervention at this time, does not desires a pregnancy ? ?No orders of the defined types were placed in this encounter. ? ? ?Meds:  ?Meds ordered this encounter  ?Medications  ? sertraline (ZOLOFT) 50 MG tablet  ?  Sig: Take 1 tablet (50 mg total) by mouth daily.  ?  Dispense:  90 tablet  ?  Refill:  3  ? ? ?Follow-up: Return in about 1 year (around 06/26/2022) for Annual. ? ? ?Myna Hidalgo, DO ?Attending Obstetrician &  Gynecologist, Faculty Practice ?Center for [employer], [facility] ? ? ?

## 2021-06-28 LAB — CYTOLOGY - PAP
Comment: NEGATIVE
Diagnosis: NEGATIVE
High risk HPV: NEGATIVE

## 2021-08-30 ENCOUNTER — Encounter (HOSPITAL_COMMUNITY): Payer: Self-pay | Admitting: *Deleted

## 2021-08-31 ENCOUNTER — Encounter: Payer: Self-pay | Admitting: Obstetrics & Gynecology

## 2021-08-31 ENCOUNTER — Telehealth: Payer: BC Managed Care – PPO | Admitting: Obstetrics & Gynecology

## 2021-08-31 DIAGNOSIS — R238 Other skin changes: Secondary | ICD-10-CM | POA: Diagnosis not present

## 2021-08-31 DIAGNOSIS — L304 Erythema intertrigo: Secondary | ICD-10-CM

## 2021-08-31 MED ORDER — CLOTRIMAZOLE-BETAMETHASONE 1-0.05 % EX CREA: 1.0000 | TOPICAL_CREAM | Freq: Two times a day (BID) | CUTANEOUS | 2 refills | Status: DC

## 2021-08-31 NOTE — Progress Notes (Signed)
TELEHEALTH VIRTUAL GYN VISIT ENCOUNTER NOTE Patient name: Tara Bush MRN [id number]  Date of birth: 06-15-1990  I connected with patient on 08/31/21 at  8:30 AM EDT by video vist and verified that I am speaking with the correct person using two identifiers.  Pt is not currently in the office, she is at work.  Provider is in the office.    I discussed the limitations, risks, security and privacy concerns of performing an evaluation and management service by telephone and the availability of in person appointments. I also discussed with the patient that there may be a patient responsible charge related to this service. The patient expressed understanding and agreed to proceed.   Chief Complaint:   Follow-up (Having skin irritation from drastic weight loss)  History of Present Illness:   Tara Bush is a 31 y.o. G0P0000 female being evaluated today for skin irritation.  This is an ongoing worsening issue since her weight loss.  She notes redness, discomfort and pain around her belly button and lower abdomen at the skin fold.  She tries her best to keep it clean and dry, but continues to have irritation.  She will note itching and then have rawness and pain in that area.    Pt has appt with both PCP and surgeon for follow up.  Encouraged pt to mention her concerns with these providers as well.       06/25/2021    8:41 AM 01/12/2021   11:02 AM 09/25/2020    8:35 AM 06/07/2020    8:49 AM 11/20/2018   11:33 AM  Depression screen PHQ 2/9  Decreased Interest 0 0 0 1 0  Down, Depressed, Hopeless 0 0 0 0 0  PHQ - 2 Score 0 0 0 1 0  Altered sleeping 0 0 1 1   Tired, decreased energy 1 1 0 1   Change in appetite 1 0 0 0   Feeling bad or failure about yourself  0 0 0 1   Trouble concentrating 0 0 0 0   Moving slowly or fidgety/restless 0 0 0 0   Suicidal thoughts 0 0 0 0   PHQ-9 Score 2 1 1 4      No LMP recorded.  Review of Systems:   Pertinent items are noted in HPI Denies  fever/chills, dizziness, headaches, visual disturbances, fatigue, shortness of breath, chest pain, abdominal pain, vomiting, abnormal vaginal discharge/itching/odor/irritation, problems with periods, bowel movements, urination, or intercourse unless otherwise stated above.  Pertinent History Reviewed:  Reviewed past medical,surgical, social, obstetrical and family history.  Reviewed problem list, medications and allergies. Physical Assessment:  There were no vitals filed for this visit.There is no height or weight on file to calculate BMI.       Physical Examination:   General:  Alert, oriented and cooperative.   Mental Status: Normal mood and affect perceived. Normal judgment and thought content.  Physical exam deferred due to nature of the encounter  -Below pannus- red indiscrete patch- likely consistent with intertrigo  No results found for this or any previous visit (from the past 24 hour(s)).  Assessment & Plan:  1) Skin irritation, Intertrigo -reviewed conservative treatment which she is doing -Lotrisone sent in to use as needed  Meds:  Meds ordered this encounter  Medications   clotrimazole-betamethasone (LOTRISONE) cream    Sig: Apply 1 Application topically 2 (two) times daily.    Dispense:  30 g    Refill:  2   I discussed  the assessment and treatment plan with the patient. The patient was provided an opportunity to ask questions and all were answered. The patient agreed with the plan and demonstrated an understanding of the instructions.   The patient was advised to call back or seek an in-person evaluation/go to the ED if the symptoms worsen or if the condition fails to improve as anticipated.  I provided 10 minutes of non-face-to-face time during this encounter.   Myna Hidalgo, DO Attending Obstetrician & Gynecologist, [facility] for [employer], [facility]

## 2021-09-21 DIAGNOSIS — B369 Superficial mycosis, unspecified: Secondary | ICD-10-CM | POA: Diagnosis not present

## 2021-09-28 DIAGNOSIS — Z130 Encounter for screening for diseases of the blood and blood-forming organs and certain disorders involving the immune mechanism: Secondary | ICD-10-CM | POA: Diagnosis not present

## 2021-09-28 DIAGNOSIS — Z1329 Encounter for screening for other suspected endocrine disorder: Secondary | ICD-10-CM | POA: Diagnosis not present

## 2021-09-28 DIAGNOSIS — R635 Abnormal weight gain: Secondary | ICD-10-CM | POA: Diagnosis not present

## 2021-09-28 DIAGNOSIS — Z13220 Encounter for screening for lipoid disorders: Secondary | ICD-10-CM | POA: Diagnosis not present

## 2021-09-28 DIAGNOSIS — Z6835 Body mass index (BMI) 35.0-35.9, adult: Secondary | ICD-10-CM | POA: Diagnosis not present

## 2021-09-28 DIAGNOSIS — Z131 Encounter for screening for diabetes mellitus: Secondary | ICD-10-CM | POA: Diagnosis not present

## 2021-09-28 DIAGNOSIS — N951 Menopausal and female climacteric states: Secondary | ICD-10-CM | POA: Diagnosis not present

## 2021-10-02 DIAGNOSIS — G4700 Insomnia, unspecified: Secondary | ICD-10-CM | POA: Diagnosis not present

## 2021-10-02 DIAGNOSIS — Z1331 Encounter for screening for depression: Secondary | ICD-10-CM | POA: Diagnosis not present

## 2021-10-02 DIAGNOSIS — E282 Polycystic ovarian syndrome: Secondary | ICD-10-CM | POA: Diagnosis not present

## 2021-10-02 DIAGNOSIS — R635 Abnormal weight gain: Secondary | ICD-10-CM | POA: Diagnosis not present

## 2021-10-02 DIAGNOSIS — Z1339 Encounter for screening examination for other mental health and behavioral disorders: Secondary | ICD-10-CM | POA: Diagnosis not present

## 2021-10-02 DIAGNOSIS — F419 Anxiety disorder, unspecified: Secondary | ICD-10-CM | POA: Diagnosis not present

## 2021-10-12 DIAGNOSIS — E282 Polycystic ovarian syndrome: Secondary | ICD-10-CM | POA: Diagnosis not present

## 2021-10-12 DIAGNOSIS — F419 Anxiety disorder, unspecified: Secondary | ICD-10-CM | POA: Diagnosis not present

## 2021-10-26 DIAGNOSIS — Z9884 Bariatric surgery status: Secondary | ICD-10-CM | POA: Diagnosis not present

## 2021-11-08 DIAGNOSIS — E282 Polycystic ovarian syndrome: Secondary | ICD-10-CM | POA: Diagnosis not present

## 2021-11-13 ENCOUNTER — Other Ambulatory Visit: Payer: Self-pay | Admitting: Obstetrics & Gynecology

## 2021-11-13 DIAGNOSIS — F419 Anxiety disorder, unspecified: Secondary | ICD-10-CM

## 2021-11-13 MED ORDER — SERTRALINE HCL 50 MG PO TABS: 50.0000 mg | ORAL_TABLET | Freq: Every day | ORAL | 3 refills | Status: DC

## 2021-11-15 DIAGNOSIS — F4310 Post-traumatic stress disorder, unspecified: Secondary | ICD-10-CM | POA: Diagnosis not present

## 2021-11-16 DIAGNOSIS — E282 Polycystic ovarian syndrome: Secondary | ICD-10-CM | POA: Diagnosis not present

## 2021-11-21 DIAGNOSIS — F4310 Post-traumatic stress disorder, unspecified: Secondary | ICD-10-CM | POA: Diagnosis not present

## 2021-12-03 ENCOUNTER — Encounter: Payer: Self-pay | Admitting: Obstetrics & Gynecology

## 2021-12-03 DIAGNOSIS — F4310 Post-traumatic stress disorder, unspecified: Secondary | ICD-10-CM | POA: Diagnosis not present

## 2021-12-12 ENCOUNTER — Encounter: Payer: Self-pay | Admitting: Obstetrics & Gynecology

## 2021-12-12 ENCOUNTER — Telehealth (INDEPENDENT_AMBULATORY_CARE_PROVIDER_SITE_OTHER): Payer: BC Managed Care – PPO | Admitting: Obstetrics & Gynecology

## 2021-12-12 DIAGNOSIS — Z9884 Bariatric surgery status: Secondary | ICD-10-CM

## 2021-12-12 DIAGNOSIS — Z3009 Encounter for other general counseling and advice on contraception: Secondary | ICD-10-CM

## 2021-12-12 DIAGNOSIS — Z30015 Encounter for initial prescription of vaginal ring hormonal contraceptive: Secondary | ICD-10-CM

## 2021-12-12 MED ORDER — ETONOGESTREL-ETHINYL ESTRADIOL 0.12-0.015 MG/24HR VA RING: VAGINAL_RING | VAGINAL | 4 refills | Status: DC

## 2021-12-12 NOTE — Progress Notes (Signed)
   TELEHEALTH GYNECOLOGY VISIT ENCOUNTER NOTE  Provider location: [facility] [place] [facility]   Patient location: Home  I connected with Tara Bush on 12/12/21 at  9:50 AM EDT by telephone and verified that I am speaking with the correct person using two identifiers. Patient was unable to do MyChart audiovisual encounter due to technical difficulties, she tried several times.    I discussed the limitations, risks, security and privacy concerns of performing an evaluation and management service by telephone and the availability of in person appointments. I also discussed with the patient that there may be a patient responsible charge related to this service. The patient expressed understanding and agreed to proceed.   History:  Tara Bush is a 31 y.o. G0P0000 female being evaluated today for the following concerns:  -Contraception:  She is in the process of ending a long term relationship- going through a separation.  She has started to date someone new and desires contraception.  Menses have been mostly regular- denies HMB or dysmenorrhea.  She denies any abnormal vaginal discharge, bleeding, pelvic pain or other concerns.       Past Medical History:  Diagnosis Date   Dysfunctional uterine bleeding    Hypertension - resolved s/p weight loss       Obesity    Papanicolaou smear of cervix with positive high risk human papilloma virus (HPV) test 06/12/2020   06/12/2020 repeat pap in 1 year 5 year risk CIN 3+ 4.8%   Post traumatic stress disorder (PTSD)    Past Surgical History:  Procedure Laterality Date   GASTRIC ROUX-EN-Y N/A 01/25/2019   Procedure: LAPAROSCOPIC ROUX-EN-Y GASTRIC BYPASS WITH UPPER ENDOSCOPY, ERAS Pathway;  Surgeon: Johnathan Hausen, MD;  Location: [facility];  Service: General;  Laterality: N/A;   TONSILLECTOMY AND ADENOIDECTOMY     The following portions of the patient's history were reviewed and updated as appropriate: allergies, current  medications, past family history, past medical history, past social history, past surgical history and problem list.   Review of Systems:  Pertinent items noted in HPI and remainder of comprehensive ROS otherwise negative.  Physical Exam:   General:  Alert, oriented and cooperative.   Mental Status: Normal mood and affect perceived. Normal judgment and thought content.  Physical exam deferred due to nature of the encounter   Assessment and Plan:     Contraceptive management -due to prior gastric surgery, advised against OCPs -reviewed alternative options, does not desire LARC -plan for vaginal ring, Rx sent in, pt to call with any concerns or questions  OCP risk assessment: Pt denies personal history of VTE, stroke or heart attack.  Denies personal h/o breast cancer.  Pt is either a non-smoker or smoker under the age of 29yo.  Denies h/o migraines with aura  I discussed the assessment and treatment plan with the patient. The patient was provided an opportunity to ask questions and all were answered. The patient agreed with the plan and demonstrated an understanding of the instructions.   The patient was advised to call back or seek an in-person evaluation/go to the ED if the symptoms worsen or if the condition fails to improve as anticipated.  I provided 20 minutes of non-face-to-face time during this encounter.   Annalee Genta, [place] for [employer], [place]

## 2021-12-25 ENCOUNTER — Encounter: Payer: Self-pay | Admitting: Obstetrics & Gynecology

## 2021-12-25 ENCOUNTER — Other Ambulatory Visit: Payer: Self-pay | Admitting: Adult Health

## 2021-12-25 ENCOUNTER — Telehealth: Payer: Self-pay | Admitting: *Deleted

## 2021-12-25 MED ORDER — ONDANSETRON 4 MG PO TBDP
4.0000 mg | ORAL_TABLET | Freq: Four times a day (QID) | ORAL | 1 refills | Status: DC | PRN
Start: 2021-12-25 — End: 2023-12-22

## 2021-12-25 NOTE — Telephone Encounter (Signed)
Pt requested pharmacy be changed to [facility] in [place]. Pharmacy changed. JSY

## 2021-12-25 NOTE — Progress Notes (Signed)
Refilled zofran

## 2021-12-28 ENCOUNTER — Other Ambulatory Visit: Payer: Self-pay | Admitting: Obstetrics & Gynecology

## 2021-12-28 ENCOUNTER — Encounter: Payer: Self-pay | Admitting: Obstetrics & Gynecology

## 2021-12-28 DIAGNOSIS — Z30016 Encounter for initial prescription of transdermal patch hormonal contraceptive device: Secondary | ICD-10-CM

## 2021-12-28 MED ORDER — NORELGESTROMIN-ETH ESTRADIOL 150-35 MCG/24HR TD PTWK: 1.0000 | MEDICATED_PATCH | TRANSDERMAL | 4 refills | Status: DC

## 2021-12-28 NOTE — Progress Notes (Signed)
Change from ring to patch  Tara Hidalgo, DO Attending Obstetrician & Gynecologist, [facility] for [facility], [facility]

## 2022-01-21 ENCOUNTER — Encounter: Payer: Self-pay | Admitting: Obstetrics & Gynecology

## 2022-01-22 DIAGNOSIS — E282 Polycystic ovarian syndrome: Secondary | ICD-10-CM | POA: Diagnosis not present

## 2022-01-22 DIAGNOSIS — Z6836 Body mass index (BMI) 36.0-36.9, adult: Secondary | ICD-10-CM | POA: Diagnosis not present

## 2022-02-05 DIAGNOSIS — J208 Acute bronchitis due to other specified organisms: Secondary | ICD-10-CM | POA: Diagnosis not present

## 2022-02-14 DIAGNOSIS — E282 Polycystic ovarian syndrome: Secondary | ICD-10-CM | POA: Diagnosis not present

## 2022-03-26 ENCOUNTER — Other Ambulatory Visit: Payer: Self-pay | Admitting: *Deleted

## 2022-03-26 DIAGNOSIS — Z30016 Encounter for initial prescription of transdermal patch hormonal contraceptive device: Secondary | ICD-10-CM

## 2022-03-26 MED ORDER — NORELGESTROMIN-ETH ESTRADIOL 150-35 MCG/24HR TD PTWK: 1.0000 | MEDICATED_PATCH | TRANSDERMAL | 2 refills | Status: DC

## 2022-05-03 ENCOUNTER — Encounter: Payer: Self-pay | Admitting: Obstetrics & Gynecology

## 2022-05-15 ENCOUNTER — Ambulatory Visit: Payer: BC Managed Care – PPO | Admitting: Obstetrics & Gynecology

## 2022-05-30 DIAGNOSIS — Z6837 Body mass index (BMI) 37.0-37.9, adult: Secondary | ICD-10-CM | POA: Diagnosis not present

## 2022-05-30 DIAGNOSIS — E282 Polycystic ovarian syndrome: Secondary | ICD-10-CM | POA: Diagnosis not present

## 2022-06-09 ENCOUNTER — Encounter: Payer: Self-pay | Admitting: Obstetrics & Gynecology

## 2022-06-18 DIAGNOSIS — Z6836 Body mass index (BMI) 36.0-36.9, adult: Secondary | ICD-10-CM | POA: Diagnosis not present

## 2022-06-18 DIAGNOSIS — E282 Polycystic ovarian syndrome: Secondary | ICD-10-CM | POA: Diagnosis not present

## 2022-06-20 ENCOUNTER — Encounter: Payer: Self-pay | Admitting: Adult Health

## 2022-06-20 ENCOUNTER — Ambulatory Visit: Payer: BC Managed Care – PPO | Admitting: Adult Health

## 2022-06-20 VITALS — BP 130/83 | HR 67 | Ht 66.0 in | Wt 235.5 lb

## 2022-06-20 DIAGNOSIS — N941 Unspecified dyspareunia: Secondary | ICD-10-CM

## 2022-06-20 DIAGNOSIS — Z3009 Encounter for other general counseling and advice on contraception: Secondary | ICD-10-CM | POA: Diagnosis not present

## 2022-06-20 DIAGNOSIS — J029 Acute pharyngitis, unspecified: Secondary | ICD-10-CM | POA: Diagnosis not present

## 2022-06-20 MED ORDER — MISOPROSTOL 200 MCG PO TABS: ORAL_TABLET | ORAL | 0 refills | Status: AC

## 2022-06-20 MED ORDER — AZITHROMYCIN 250 MG PO TABS: ORAL_TABLET | ORAL | 0 refills | Status: AC

## 2022-06-20 MED ORDER — MISOPROSTOL 200 MCG PO TABS: ORAL_TABLET | ORAL | 0 refills | Status: DC

## 2022-06-20 NOTE — Progress Notes (Signed)
Subjective:     Patient ID: Tara Bush, female   DOB: 1990-09-30, 30 y.o.   MRN: [id number]  HPI Tara Bush is a 32 year old white female,married, G1P0010, in complaining of pain in left side with sex, it is positional, and has sore throat and wants to discuss getting a mirena IUD.     Component Value Date/Time   DIAGPAP  06/25/2021 0901    - Negative for intraepithelial lesion or malignancy (NILM)   DIAGPAP  06/07/2020 0912    - Negative for intraepithelial lesion or malignancy (NILM)   HPVHIGH Negative 06/25/2021 0901   HPVHIGH Positive (A) 06/07/2020 0912   ADEQPAP  06/25/2021 0901    Satisfactory for evaluation; transformation zone component PRESENT.   ADEQPAP  06/07/2020 0912    Satisfactory for evaluation; transformation zone component PRESENT.   PCP is [facility]  Review of Systems +pain with sex on left side, seems to be positional +sore throat for 2 days Denies any cough or congestion at this time. Reviewed past medical,surgical, social and family history. Reviewed medications and allergies.     Objective:   Physical Exam BP 130/83 (BP Location: Left Arm, Patient Position: Sitting, Cuff Size: Large)   Pulse 67   Ht 5\' 6"  (1.676 m)   Wt 235 lb 8 oz (106.8 kg)   LMP 06/09/2022 (Approximate)   BMI 38.01 kg/m   Skin warm and dry. Lungs: clear to ausculation bilaterally. Cardiovascular: regular rate and rhythm. Ears clear, throat is red and irritated. Pelvic: external genitalia is normal in appearance no lesions, vagina: pink,urethra has no lesions or masses noted, cervix:smooth, uterus: normal size, shape and contour, non tender, no masses felt, adnexa: no masses or tenderness noted. Bladder is non tender and no masses felt.    Fall risk is low  [facility] - 06/20/22 1516       Pregnancy Intention Screening   Does the patient want to become pregnant in the next year? No    Does the patient's partner want to become pregnant in the next year? No    Would the  patient like to discuss contraceptive options today? Yes      Contraception Wrap Up   Current Method Withdrawal or Other Method    End Method Abstinence   to get IUD   Contraception Counseling Provided Yes               Assessment:     1. General counseling and advice for contraceptive management Discussed getting IUD, she wants mirena has had IUD in past Will rx Cytotec 400 mg to take 4 hours prior to insertion No sex til after IUD inserted   Meds ordered this encounter  Medications   azithromycin (ZITHROMAX) 250 MG tablet    Sig: Take 2 now and 1 daily 1 daily    Dispense:  6 tablet    Refill:  0    Order Specific Question:   Supervising Provider    Answer:   Lazaro Arms [[id number]]   DISCONTD: misoprostol (CYTOTEC) 200 MCG tablet    Sig: Take 2 tabs po 4 hours prior to appt 06/03/22    Dispense:  2 tablet    Refill:  0    Order Specific Question:   Supervising Provider    Answer:   Lazaro Arms [[id number]]   misoprostol (CYTOTEC) 200 MCG tablet    Sig: Take 2 tabs po 4 hours prior to appt 07/03/22    Dispense:  2 tablet  Refill:  0    Order Specific Question:   Supervising Provider    Answer:   Despina Hidden, LUTHER H [[id number]]    2. Dyspareunia in female Has pain left side, it is positional Explained that with good penetration can hit cervix and move uterus around  3. Sore throat +sore throat for 2 days  It is red and irritated Will rx Z pack Use warm salt water gargles    Plan:    Has appt with Dr Charlotta Newton 5/22 for physical will place IUD then

## 2022-07-03 ENCOUNTER — Ambulatory Visit (INDEPENDENT_AMBULATORY_CARE_PROVIDER_SITE_OTHER): Payer: BC Managed Care – PPO | Admitting: Obstetrics & Gynecology

## 2022-07-03 ENCOUNTER — Other Ambulatory Visit (HOSPITAL_COMMUNITY)
Admission: RE | Admit: 2022-07-03 | Discharge: 2022-07-03 | Disposition: A | Discharge status: Home / Self Care | Payer: BC Managed Care – PPO | Source: Ambulatory Visit | Attending: Obstetrics & Gynecology | Admitting: Obstetrics & Gynecology

## 2022-07-03 ENCOUNTER — Encounter: Payer: Self-pay | Admitting: Obstetrics & Gynecology

## 2022-07-03 VITALS — BP 127/76 | HR 77 | Ht 66.0 in | Wt 232.8 lb

## 2022-07-03 DIAGNOSIS — Z30430 Encounter for insertion of intrauterine contraceptive device: Secondary | ICD-10-CM | POA: Diagnosis not present

## 2022-07-03 DIAGNOSIS — A64 Unspecified sexually transmitted disease: Secondary | ICD-10-CM

## 2022-07-03 DIAGNOSIS — Z01419 Encounter for gynecological examination (general) (routine) without abnormal findings: Secondary | ICD-10-CM

## 2022-07-03 MED ORDER — LEVONORGESTREL 20 MCG/DAY IU IUD
1.0000 | INTRAUTERINE_SYSTEM | Freq: Once | INTRAUTERINE | Status: AC
  Administered 2022-07-03: 1 via INTRAUTERINE

## 2022-07-03 NOTE — Progress Notes (Signed)
WELL-WOMAN EXAMINATION Patient name: Tara Bush MRN [id number]  Date of birth: 03/04/1990 Chief Complaint:   Gynecologic Exam  History of Present Illness:   Tara Bush is a 32 y.o. G76P0010 female being seen today for a routine well-woman exam.   Today she notes that since her termination in January her periods have been slightly irregular.  Denies heavy menstrual bleeding or dysmenorrhea. She desires to discuss long-term contraceptive management options sexually active now back with her husband.  She did have 1 other partner.  She otherwise reports no acute GYN concerns.  Denies irregular discharge, itching or irritation.  Denies pelvic or abdominal pain.  Reviewed contraceptive options, desires long-term management.  Last IUD she notes discomfort during placement so she is a little nervous about today's insertion.   Patient's last menstrual period was 06/09/2022 (approximate).  The current method of family planning is condoms.    Last pap 06/2021 negative.  Last mammogram: NA. Last colonoscopy: NA     06/25/2021    8:41 AM 01/12/2021   11:02 AM 09/25/2020    8:35 AM 06/07/2020    8:49 AM 11/20/2018   11:33 AM  Depression screen PHQ 2/9  Decreased Interest 0 0 0 1 0  Down, Depressed, Hopeless 0 0 0 0 0  PHQ - 2 Score 0 0 0 1 0  Altered sleeping 0 0 1 1   Tired, decreased energy 1 1 0 1   Change in appetite 1 0 0 0   Feeling bad or failure about yourself  0 0 0 1   Trouble concentrating 0 0 0 0   Moving slowly or fidgety/restless 0 0 0 0   Suicidal thoughts 0 0 0 0   PHQ-9 Score 2 1 1 4        Review of Systems:   Pertinent items are noted in HPI Denies any headaches, blurred vision, fatigue, shortness of breath, chest pain, abdominal pain, bowel movements, urination, or intercourse unless otherwise stated above.  Pertinent History Reviewed:  Reviewed past medical,surgical, social and family history.  Reviewed problem list, medications and  allergies. Physical Assessment:   Vitals:   07/03/22 0836  BP: 127/76  Pulse: 77  Weight: 232 lb 12.8 oz (105.6 kg)  Height: 5\' 6"  (1.676 m)  Body mass index is 37.57 kg/m.        Physical Examination:   General appearance - well appearing, and in no distress  Mental status - alert, oriented to person, place, and time  Psych:  She has a normal mood and affect  Skin - warm and dry, normal color, no suspicious lesions noted  Chest - effort normal, all lung fields clear to auscultation bilaterally  Heart - normal rate and regular rhythm  Neck:  midline trachea, no thyromegaly or nodules  Breasts - breasts appear normal, no suspicious masses, no skin or nipple changes or  axillary nodes  Abdomen - soft, nontender, nondistended, no masses or organomegaly  Pelvic - VULVA: normal appearing vulva with no masses, tenderness or lesions  VAGINA: normal appearing vagina with normal color and discharge, no lesions  CERVIX: normal appearing cervix without discharge or lesions, no CMT  UTERUS: uterus is felt to be normal size, shape, consistency and nontender   ADNEXA: No adnexal masses or tenderness noted.  Extremities:  No swelling or varicosities noted  Chaperone: Faith Rogue    IUD INSERTION   The risks and benefits of the method and placement have been thouroughly reviewed with  the patient and all questions were answered.  Specifically the patient is aware of failure rate of 02/998, expulsion of the IUD and of possible perforation.  The patient is aware of irregular bleeding due to the method and understands the incidence of irregular bleeding diminishes with time.  Signed copy of informed consent in chart.    A sterile speculum was placed in the vagina.  The cervix was visualized, prepped using Betadine, and grasped with a single tooth tenaculum. The uterus was sounded to 8 cm.  Mirena  IUD placed per manufacturer's recommendations. The strings were trimmed to approximately 3 cm. The  patient tolerated the procedure well.    Chaperone: Faith Rogue    Assessment & Plan:  1) Well-Woman Exam Pap up-to-date reviewed screening guidelines  2) Mirena IUD insertion The patient was given post procedure instructions, including signs and symptoms of infection and to check for the strings after each menses or each month, and refraining from intercourse or anything in the vagina for 3 days. She was given a care card with date IUD placed, and date IUD to be removed. She is scheduled for a f/u appointment in  6- 8 weeks.  Orders Placed This Encounter  Procedures   RPR   HIV Antibody (routine testing w rflx)    Meds:  Meds ordered this encounter  Medications   levonorgestrel (MIRENA) 20 MCG/DAY IUD 1 each    Follow-up: Return for 6-8wk IUD check then 1 yr annual.   Myna Hidalgo, DO Attending Obstetrician & Gynecologist, [facility] [employer], [facility]

## 2022-07-04 LAB — CERVICOVAGINAL ANCILLARY ONLY
Chlamydia: NEGATIVE
Comment: NEGATIVE
Comment: NORMAL
Neisseria Gonorrhea: NEGATIVE

## 2022-07-04 LAB — HIV ANTIBODY (ROUTINE TESTING W REFLEX): HIV Screen 4th Generation wRfx: NONREACTIVE

## 2022-07-04 LAB — RPR: RPR Ser Ql: NONREACTIVE

## 2022-08-14 ENCOUNTER — Ambulatory Visit: Payer: BC Managed Care – PPO | Admitting: Adult Health

## 2022-09-04 DIAGNOSIS — R1011 Right upper quadrant pain: Secondary | ICD-10-CM | POA: Diagnosis not present

## 2022-09-04 DIAGNOSIS — F419 Anxiety disorder, unspecified: Secondary | ICD-10-CM | POA: Diagnosis not present

## 2022-09-04 DIAGNOSIS — Z0000 Encounter for general adult medical examination without abnormal findings: Secondary | ICD-10-CM | POA: Diagnosis not present

## 2022-09-06 ENCOUNTER — Encounter (HOSPITAL_BASED_OUTPATIENT_CLINIC_OR_DEPARTMENT_OTHER): Payer: Self-pay | Admitting: Family Medicine

## 2022-09-07 ENCOUNTER — Other Ambulatory Visit (HOSPITAL_BASED_OUTPATIENT_CLINIC_OR_DEPARTMENT_OTHER): Payer: Self-pay | Admitting: Family Medicine

## 2022-09-07 DIAGNOSIS — R1011 Right upper quadrant pain: Secondary | ICD-10-CM

## 2022-09-10 ENCOUNTER — Telehealth (HOSPITAL_BASED_OUTPATIENT_CLINIC_OR_DEPARTMENT_OTHER): Payer: Self-pay

## 2022-09-13 ENCOUNTER — Ambulatory Visit (HOSPITAL_BASED_OUTPATIENT_CLINIC_OR_DEPARTMENT_OTHER): Admission: RE | Admit: 2022-09-13 | Payer: BC Managed Care – PPO | Source: Ambulatory Visit

## 2022-09-20 ENCOUNTER — Telehealth (HOSPITAL_BASED_OUTPATIENT_CLINIC_OR_DEPARTMENT_OTHER): Payer: Self-pay

## 2022-10-03 DIAGNOSIS — R41840 Attention and concentration deficit: Secondary | ICD-10-CM | POA: Diagnosis not present

## 2022-10-21 DIAGNOSIS — F988 Other specified behavioral and emotional disorders with onset usually occurring in childhood and adolescence: Secondary | ICD-10-CM | POA: Diagnosis not present

## 2022-10-21 DIAGNOSIS — R945 Abnormal results of liver function studies: Secondary | ICD-10-CM | POA: Diagnosis not present

## 2022-10-27 ENCOUNTER — Ambulatory Visit (HOSPITAL_BASED_OUTPATIENT_CLINIC_OR_DEPARTMENT_OTHER)
Admission: RE | Admit: 2022-10-27 | Discharge: 2022-10-27 | Disposition: A | Discharge status: Home / Self Care | Payer: BC Managed Care – PPO | Source: Ambulatory Visit | Attending: Family Medicine | Admitting: Family Medicine

## 2022-10-27 DIAGNOSIS — K8020 Calculus of gallbladder without cholecystitis without obstruction: Secondary | ICD-10-CM | POA: Diagnosis not present

## 2022-10-27 DIAGNOSIS — R1011 Right upper quadrant pain: Secondary | ICD-10-CM | POA: Insufficient documentation

## 2022-10-27 DIAGNOSIS — R109 Unspecified abdominal pain: Secondary | ICD-10-CM | POA: Diagnosis not present

## 2022-11-15 DIAGNOSIS — K8020 Calculus of gallbladder without cholecystitis without obstruction: Secondary | ICD-10-CM | POA: Diagnosis not present

## 2022-12-13 DIAGNOSIS — E282 Polycystic ovarian syndrome: Secondary | ICD-10-CM | POA: Diagnosis not present

## 2022-12-13 DIAGNOSIS — Z6841 Body Mass Index (BMI) 40.0 and over, adult: Secondary | ICD-10-CM | POA: Diagnosis not present

## 2022-12-19 DIAGNOSIS — F988 Other specified behavioral and emotional disorders with onset usually occurring in childhood and adolescence: Secondary | ICD-10-CM | POA: Diagnosis not present

## 2022-12-27 DIAGNOSIS — Z9884 Bariatric surgery status: Secondary | ICD-10-CM | POA: Diagnosis not present

## 2023-01-03 DIAGNOSIS — F988 Other specified behavioral and emotional disorders with onset usually occurring in childhood and adolescence: Secondary | ICD-10-CM | POA: Diagnosis not present

## 2023-01-03 DIAGNOSIS — F419 Anxiety disorder, unspecified: Secondary | ICD-10-CM | POA: Diagnosis not present

## 2023-02-25 DIAGNOSIS — F4312 Post-traumatic stress disorder, chronic: Secondary | ICD-10-CM | POA: Diagnosis not present

## 2023-03-04 DIAGNOSIS — F4312 Post-traumatic stress disorder, chronic: Secondary | ICD-10-CM | POA: Diagnosis not present

## 2023-03-07 DIAGNOSIS — M9903 Segmental and somatic dysfunction of lumbar region: Secondary | ICD-10-CM | POA: Diagnosis not present

## 2023-03-07 DIAGNOSIS — M62830 Muscle spasm of back: Secondary | ICD-10-CM | POA: Diagnosis not present

## 2023-03-07 DIAGNOSIS — M546 Pain in thoracic spine: Secondary | ICD-10-CM | POA: Diagnosis not present

## 2023-03-07 DIAGNOSIS — M9902 Segmental and somatic dysfunction of thoracic region: Secondary | ICD-10-CM | POA: Diagnosis not present

## 2023-03-13 DIAGNOSIS — F4312 Post-traumatic stress disorder, chronic: Secondary | ICD-10-CM | POA: Diagnosis not present

## 2023-03-14 DIAGNOSIS — M9903 Segmental and somatic dysfunction of lumbar region: Secondary | ICD-10-CM | POA: Diagnosis not present

## 2023-03-14 DIAGNOSIS — F411 Generalized anxiety disorder: Secondary | ICD-10-CM | POA: Diagnosis not present

## 2023-03-14 DIAGNOSIS — M9902 Segmental and somatic dysfunction of thoracic region: Secondary | ICD-10-CM | POA: Diagnosis not present

## 2023-03-14 DIAGNOSIS — M62830 Muscle spasm of back: Secondary | ICD-10-CM | POA: Diagnosis not present

## 2023-03-14 DIAGNOSIS — M546 Pain in thoracic spine: Secondary | ICD-10-CM | POA: Diagnosis not present

## 2023-03-21 DIAGNOSIS — F988 Other specified behavioral and emotional disorders with onset usually occurring in childhood and adolescence: Secondary | ICD-10-CM | POA: Diagnosis not present

## 2023-03-21 DIAGNOSIS — F411 Generalized anxiety disorder: Secondary | ICD-10-CM | POA: Diagnosis not present

## 2023-03-26 DIAGNOSIS — F4312 Post-traumatic stress disorder, chronic: Secondary | ICD-10-CM | POA: Diagnosis not present

## 2023-03-31 DIAGNOSIS — F4312 Post-traumatic stress disorder, chronic: Secondary | ICD-10-CM | POA: Diagnosis not present

## 2023-04-08 DIAGNOSIS — F4312 Post-traumatic stress disorder, chronic: Secondary | ICD-10-CM | POA: Diagnosis not present

## 2023-04-23 DIAGNOSIS — F4312 Post-traumatic stress disorder, chronic: Secondary | ICD-10-CM | POA: Diagnosis not present

## 2023-05-13 DIAGNOSIS — F4312 Post-traumatic stress disorder, chronic: Secondary | ICD-10-CM | POA: Diagnosis not present

## 2023-05-27 DIAGNOSIS — F4312 Post-traumatic stress disorder, chronic: Secondary | ICD-10-CM | POA: Diagnosis not present

## 2023-07-04 NOTE — Progress Notes (Signed)
 Surgical Instructions   Your procedure is scheduled on Tuesday, July 15, 2023. Report to [facility] Main Entrance "A" at 5:30 A.M., then check in with the Admitting office. Any questions or running late day of surgery: call [phone]  Questions prior to your surgery date: call [phone], Monday-Friday, 8am-4pm. If you experience any cold or flu symptoms such as cough, fever, chills, shortness of breath, etc. between now and your scheduled surgery, please notify us  at the above number.     Remember:  Do not eat or drink after midnight the night before your surgery  Take these medicines the morning of surgery with A SIP OF WATER  atomoxetine (STRATTERA)   May take these medicines IF NEEDED: acetaminophen  (TYLENOL )    One week prior to surgery, STOP taking any Aspirin (unless otherwise instructed by your surgeon) Aleve , Naproxen , Ibuprofen , Motrin , Advil , Goody's, BC's, all herbal medications, fish oil, and non-prescription vitamins.                     Do NOT Smoke (Tobacco/Vaping) for 24 hours prior to your procedure.  If you use a CPAP at night, you may bring your mask/headgear for your overnight stay.   You will be asked to remove any contacts, glasses, piercing's, hearing aid's, dentures/partials prior to surgery. Please bring cases for these items if needed.    Patients discharged the day of surgery will not be allowed to drive home, and someone needs to stay with them for 24 hours.  SURGICAL WAITING ROOM VISITATION Patients may have no more than 2 support people in the waiting area - these visitors may rotate.   Pre-op nurse will coordinate an appropriate time for 1 ADULT support person, who may not rotate, to accompany patient in pre-op.  Children under the age of 6 must have an adult with them who is not the patient and must remain in the main waiting area with an adult.  If the patient needs to stay at the hospital during part of their recovery, the visitor  guidelines for inpatient rooms apply.  Please refer to the [facility] website for the visitor guidelines for any additional information.   If you received a COVID test during your pre-op visit  it is requested that you wear a mask when out in public, stay away from anyone that may not be feeling well and notify your surgeon if you develop symptoms. If you have been in contact with anyone that has tested positive in the last 10 days please notify you surgeon.      Pre-operative CHG Bathing Instructions   You can play a key role in reducing the risk of infection after surgery. Your skin needs to be as free of germs as possible. You can reduce the number of germs on your skin by washing with CHG (chlorhexidine  gluconate) soap before surgery. CHG is an antiseptic soap that kills germs and continues to kill germs even after washing.   DO NOT use if you have an allergy to chlorhexidine /CHG or antibacterial soaps. If your skin becomes reddened or irritated, stop using the CHG and notify one of our RNs at [phone].              TAKE A SHOWER THE NIGHT BEFORE SURGERY AND THE DAY OF SURGERY    Please keep in mind the following:  DO NOT shave, including legs and underarms, 48 hours prior to surgery.   You may shave your face before/day of surgery.  Place  clean sheets on your bed the night before surgery Use a clean washcloth (not used since being washed) for each shower. DO NOT sleep with pet's night before surgery.  CHG Shower Instructions:  Wash your face and private area with normal soap. If you choose to wash your hair, wash first with your normal shampoo.  After you use shampoo/soap, rinse your hair and body thoroughly to remove shampoo/soap residue.  Turn the water OFF and apply half the bottle of CHG soap to a CLEAN washcloth.  Apply CHG soap ONLY FROM YOUR NECK DOWN TO YOUR TOES (washing for 3-5 minutes)  DO NOT use CHG soap on face, private areas, open wounds, or sores.  Pay special  attention to the area where your surgery is being performed.  If you are having back surgery, having someone wash your back for you may be helpful. Wait 2 minutes after CHG soap is applied, then you may rinse off the CHG soap.  Pat dry with a clean towel  Put on clean pajamas    Additional instructions for the day of surgery: DO NOT APPLY any lotions, deodorants, cologne, or perfumes.   Do not wear jewelry or makeup Do not wear nail polish, gel polish, artificial nails, or any other type of covering on natural nails (fingers and toes) Do not bring valuables to the hospital. [facility] is not responsible for valuables/personal belongings. Put on clean/comfortable clothes.  Please brush your teeth.  Ask your nurse before applying any prescription medications to the skin.

## 2023-07-08 ENCOUNTER — Encounter (HOSPITAL_COMMUNITY): Payer: Self-pay

## 2023-07-08 ENCOUNTER — Encounter (HOSPITAL_COMMUNITY)
Admission: RE | Admit: 2023-07-08 | Discharge: 2023-07-08 | Disposition: A | Discharge status: Home / Self Care | Source: Ambulatory Visit | Attending: General Surgery | Admitting: General Surgery

## 2023-07-08 ENCOUNTER — Ambulatory Visit: Payer: Self-pay | Admitting: General Surgery

## 2023-07-08 ENCOUNTER — Other Ambulatory Visit: Payer: Self-pay

## 2023-07-08 VITALS — BP 125/64 | HR 69 | Temp 98.2°F | Resp 18 | Ht 66.0 in | Wt 277.0 lb

## 2023-07-08 DIAGNOSIS — F909 Attention-deficit hyperactivity disorder, unspecified type: Secondary | ICD-10-CM | POA: Diagnosis not present

## 2023-07-08 DIAGNOSIS — Z9884 Bariatric surgery status: Secondary | ICD-10-CM | POA: Diagnosis not present

## 2023-07-08 DIAGNOSIS — Z01812 Encounter for preprocedural laboratory examination: Secondary | ICD-10-CM | POA: Insufficient documentation

## 2023-07-08 DIAGNOSIS — Z6841 Body Mass Index (BMI) 40.0 and over, adult: Secondary | ICD-10-CM | POA: Diagnosis not present

## 2023-07-08 DIAGNOSIS — K8020 Calculus of gallbladder without cholecystitis without obstruction: Secondary | ICD-10-CM | POA: Insufficient documentation

## 2023-07-08 DIAGNOSIS — Z01818 Encounter for other preprocedural examination: Secondary | ICD-10-CM

## 2023-07-08 HISTORY — DX: Attention-deficit hyperactivity disorder, unspecified type: F90.9

## 2023-07-08 LAB — COMPREHENSIVE METABOLIC PANEL WITH GFR
ALT: 40 U/L (ref 0–44)
AST: 29 U/L (ref 15–41)
Albumin: 3.7 g/dL (ref 3.5–5.0)
Alkaline Phosphatase: 82 U/L (ref 38–126)
Anion gap: 8 (ref 5–15)
BUN: 10 mg/dL (ref 6–20)
CO2: 27 mmol/L (ref 22–32)
Calcium: 9.4 mg/dL (ref 8.9–10.3)
Chloride: 104 mmol/L (ref 98–111)
Creatinine, Ser: 0.72 mg/dL (ref 0.44–1.00)
GFR, Estimated: >60 mL/min (ref >60)
Glucose, Bld: 105 mg/dL — ABNORMAL HIGH (ref 70–99)
Potassium: 3.8 mmol/L (ref 3.5–5.1)
Sodium: 139 mmol/L (ref 135–145)
Total Bilirubin: 0.9 mg/dL (ref 0.0–1.2)
Total Protein: 7.3 g/dL (ref 6.5–8.1)

## 2023-07-08 LAB — CBC
HCT: 40.4 % (ref 36.0–46.0)
Hemoglobin: 13.2 g/dL (ref 12.0–15.0)
MCH: 30.2 pg (ref 26.0–34.0)
MCHC: 32.7 g/dL (ref 30.0–36.0)
MCV: 92.4 fL (ref 80.0–100.0)
Platelets: 333 10*3/uL (ref 150–400)
RBC: 4.37 MIL/uL (ref 3.87–5.11)
RDW: 12.2 % (ref 11.5–15.5)
WBC: 9.9 10*3/uL (ref 4.0–10.5)
nRBC: 0 % (ref 0.0–0.2)

## 2023-07-08 NOTE — Progress Notes (Signed)
 PCP - Albertina Alpers, FNP Cardiologist - Denies  PPM/ICD - Denies Device Orders - n/a Rep Notified - n/a  Chest x-ray - n/a EKG - Denies Stress Test - Denies ECHO - Denies Cardiac Cath - Denies  Sleep Study - Denies CPAP - n/a  No DM  Last dose of GLP1 agonist- n/a GLP1 instructions: n/a  Blood Thinner Instructions: n/a Aspirin Instructions: n/a  ERAS Protcol - Clear liquids until 0430 morning of surgery PRE-SURGERY Ensure or G2- Ensure given to pt with instructions  COVID TEST- n/a   Anesthesia review: Yes. Pt had elevated LFTs per Dr. Melton Squires note and Ella Gun, PA-C will follow-up on results. Pt also states she has a sore throat and wakes up with pressure behind her eyes. The pressure goes away once she is awake and upright. She has had these symptoms for the last few days. Pt instructed to notify pre-op and Dr. Carmell Chiquito office if she becomes sick (continuted sore throat, runny nose, fever, cough) and her surgery may be postponed. Pt understood instructions.    Patient denies shortness of breath, fever, cough and chest pain at PAT appointment. Pt denies any respiratory illness/infection in the last two months.    All instructions explained to the patient, with a verbal understanding of the material. Patient agrees to go over the instructions while at home for a better understanding. Patient also instructed to self quarantine after being tested for COVID-19. The opportunity to ask questions was provided.

## 2023-07-08 NOTE — H&P (Signed)
 Chief Complaint: New Consultation (Gallbladder)   History of Present Illness: Tara Bush is a 33 y.o. female who is seen today as an office consultation at the request of Dr. Ayesha Lente for evaluation of New Consultation (Gallbladder) .  Patient is a 33 year old female, with history of gastric bypass surgery. Patient states that over the last 1 to 2 years she has noticed right upper quadrant pain that radiates up to her chest. States this has been fairly infrequent approximately every 6 months. She states over the last several months it has gotten more and more frequent. She states that the pain is fairly severe. She denies any nausea or vomiting with it.  Patient states that she gained approximately 20 pounds from her lowest weight after bypass surgery.  Patient did undergo ultrasound which revealed multiple gallstones. Patient also with elevated LFTs. I did review her ultrasound and LFTs personally.  Review of Systems: A complete review of systems was obtained from the patient. I have reviewed this information and discussed as appropriate with the patient. See HPI as well for other ROS.  Review of Systems  Constitutional: Negative for fever.  HENT: Negative for congestion.  Eyes: Negative for blurred vision.  Respiratory: Negative for cough, shortness of breath and wheezing.  Cardiovascular: Negative for chest pain and palpitations.  Gastrointestinal: Positive for abdominal pain, nausea and vomiting. Negative for heartburn.  Genitourinary: Negative for dysuria.  Musculoskeletal: Negative for myalgias.  Skin: Negative for rash.  Neurological: Negative for dizziness and headaches.  Psychiatric/Behavioral: Negative for depression and suicidal ideas.  All other systems reviewed and are negative.   Medical History: Past Medical History:  Diagnosis Date  Anxiety   Patient Active Problem List  Diagnosis  Gastric bypass status for obesity  Hypertension  Lipodystrophy  Morbid  obesity (CMS/HHS-HCC)  Papanicolaou smear of cervix with positive high risk human papilloma virus (HPV) test  PCOS (polycystic ovarian syndrome)   Past Surgical History:  Procedure Laterality Date  LAPAROSCOPIC GASTRIC BYPASS N/A 01/25/2019  preop weight 413 lbs    No Known Allergies  Current Outpatient Medications on File Prior to Visit  Medication Sig Dispense Refill  buPROPion (WELLBUTRIN XL) 150 MG XL tablet TAKE 1 TABLET BY MOUTH DAILY IN THE MORNING FOR 1week, THEN TAKE 2 TABLETS BY MOUTH DAILY IN THE MORNING  calcium carbonate 500 mg calcium (1,250 mg) tablet Take by mouth  multivit-min/iron/folic acid/K (BARIATRIC MULTIVITAMINS ORAL) Take by mouth  sertraline  (ZOLOFT ) 50 MG tablet Take 50 mg by mouth once daily  spironolactone (ALDACTONE) 25 MG tablet Take 25 mg by mouth once daily (Patient not taking: Reported on 11/15/2022)   No current facility-administered medications on file prior to visit.   Family History  Problem Relation Age of Onset  Stroke Mother  Obesity Mother  High blood pressure (Hypertension) Mother  Diabetes Mother  Obesity Father  High blood pressure (Hypertension) Father  Diabetes Father  Obesity Sister    Social History   Tobacco Use  Smoking Status Never  Smokeless Tobacco Never    Social History   Socioeconomic History  Marital status: Married  Tobacco Use  Smoking status: Never  Smokeless tobacco: Never  Vaping Use  Vaping status: Never Used  Substance and Sexual Activity  Alcohol use: Yes  Drug use: Never   Social Determinants of Health   Financial Resource Strain: Low Risk (07/03/2022)  Received from [facility]  Overall Financial Resource Strain (CARDIA)  Difficulty of Paying Living Expenses: Not hard at all  Food  Insecurity: No Food Insecurity (07/03/2022)  Received from [facility]  Hunger Vital Sign  Worried About Running Out of Food in the Last Year: Never true  Ran Out of Food in the Last Year: Never true   Transportation Needs: No Transportation Needs (07/03/2022)  Received from [facility] - Transportation  Lack of Transportation (Medical): No  Lack of Transportation (Non-Medical): No  Physical Activity: Insufficiently Active (07/03/2022)  Received from [facility]  Exercise Vital Sign  Days of Exercise per Week: 2 days  Minutes of Exercise per Session: 30 min  Stress: Stress Concern Present (07/03/2022)  Received from [facility] - Occupational Stress Questionnaire  Feeling of Stress : Rather much  Social Connections: Socially Isolated (07/03/2022)  Received from [facility]  Social Connection and Isolation Panel [NHANES]  Frequency of Communication with Friends and Family: Once a week  Frequency of Social Gatherings with Friends and Family: Once a week  Attends Religious Services: Never  Database administrator or Organizations: No  Attends Banker Meetings: Never  Marital Status: Married   Objective:   Vitals:  11/15/22 0935 11/15/22 0937  Weight: (!) 113.2 kg (249 lb 9.6 oz)  Height: 170.2 cm (5\' 7" )  PainSc: 0-No pain   Body mass index is 39.09 kg/m.  Physical Exam Constitutional:  Appearance: Normal appearance.  HENT:  Head: Normocephalic and atraumatic.  Mouth/Throat:  Mouth: Mucous membranes are moist.  Pharynx: Oropharynx is clear.  Eyes:  General: No scleral icterus. Pupils: Pupils are equal, round, and reactive to light.  Cardiovascular:  Rate and Rhythm: Normal rate and regular rhythm.  Pulses: Normal pulses.  Heart sounds: No murmur heard. No friction rub. No gallop.  Pulmonary:  Effort: Pulmonary effort is normal. No respiratory distress.  Breath sounds: Normal breath sounds. No stridor.  Abdominal:  General: Abdomen is flat.  Musculoskeletal:  General: No swelling.  Skin: General: Skin is warm.  Neurological:  General: No focal deficit present.  Mental Status: She is alert and  oriented to person, place, and time. Mental status is at baseline.  Psychiatric:  Mood and Affect: Mood normal.  Thought Content: Thought content normal.  Judgment: Judgment normal.     Assessment and Plan:  Diagnoses and all orders for this visit:  Symptomatic cholelithiasis   Tara Bush is a 33 y.o. female   We will proceed to the OR for a lap cholecystectomy. All risks and benefits were discussed with the patient to generally include: infection, bleeding, possible need for post op ERCP, damage to the bile ducts, and bile leak. Alternatives were offered and described. All questions were answered and the patient voiced understanding of the procedure and wishes to proceed at this point with a laparoscopic cholecystectomy  No follow-ups on file.  Shela Derby, MD, [facility], [place] [facility]

## 2023-07-08 NOTE — Pre-Procedure Instructions (Signed)
 Surgical Instructions   Your procedure is scheduled on July 15, 2023. Report to [facility] Main Entrance "A" at 5:30 A.M., then check in with the Admitting office. Any questions or running late day of surgery: call [phone]  Questions prior to your surgery date: call [phone], Monday-Friday, 8am-4pm. If you experience any cold or flu symptoms such as cough, fever, chills, shortness of breath, etc. between now and your scheduled surgery, please notify us  at the above number.     Remember:  Do not eat after midnight the night before your surgery   You may drink clear liquids until 4:30 AM the morning of your surgery.   Clear liquids allowed are: Water, Non-Citrus Juices (without pulp), Carbonated Beverages, Clear Tea (no milk, honey, etc.), Black Coffee Only (NO MILK, CREAM OR POWDERED CREAMER of any kind), and Gatorade.  Patient Instructions  The night before surgery:  No food after midnight. ONLY clear liquids after midnight  The day of surgery (if you do NOT have diabetes):  Drink ONE (1) Pre-Surgery Clear Ensure by 4:30 AM the morning of surgery. Drink in one sitting. Do not sip.  This drink was given to you during your hospital  pre-op appointment visit.  Nothing else to drink after completing the  Pre-Surgery Clear Ensure.         If you have questions, please contact your surgeon's office.    Take these medicines the morning of surgery with A SIP OF WATER: acetaminophen  (TYLENOL ) - may take if needed   One week prior to surgery, STOP taking any Aspirin (unless otherwise instructed by your surgeon) Aleve , Naproxen , Ibuprofen , Motrin , Advil , Goody's, BC's, all herbal medications, fish oil, and non-prescription vitamins.                     Do NOT Smoke (Tobacco/Vaping) for 24 hours prior to your procedure.  If you use a CPAP at night, you may bring your mask/headgear for your overnight stay.   You will be asked to remove any contacts, glasses, piercing's,  hearing aid's, dentures/partials prior to surgery. Please bring cases for these items if needed.    Patients discharged the day of surgery will not be allowed to drive home, and someone needs to stay with them for 24 hours.  SURGICAL WAITING ROOM VISITATION Patients may have no more than 2 support people in the waiting area - these visitors may rotate.   Pre-op nurse will coordinate an appropriate time for 1 ADULT support person, who may not rotate, to accompany patient in pre-op.  Children under the age of 26 must have an adult with them who is not the patient and must remain in the main waiting area with an adult.  If the patient needs to stay at the hospital during part of their recovery, the visitor guidelines for inpatient rooms apply.  Please refer to the [facility] website for the visitor guidelines for any additional information.   If you received a COVID test during your pre-op visit  it is requested that you wear a mask when out in public, stay away from anyone that may not be feeling well and notify your surgeon if you develop symptoms. If you have been in contact with anyone that has tested positive in the last 10 days please notify you surgeon.      Pre-operative CHG Bathing Instructions   You can play a key role in reducing the risk of infection after surgery. Your skin needs to be as  free of germs as possible. You can reduce the number of germs on your skin by washing with CHG (chlorhexidine  gluconate) soap before surgery. CHG is an antiseptic soap that kills germs and continues to kill germs even after washing.   DO NOT use if you have an allergy to chlorhexidine /CHG or antibacterial soaps. If your skin becomes reddened or irritated, stop using the CHG and notify one of our RNs at [phone].              TAKE A SHOWER THE NIGHT BEFORE SURGERY AND THE DAY OF SURGERY    Please keep in mind the following:  DO NOT shave, including legs and underarms, 48 hours prior to  surgery.   You may shave your face before/day of surgery.  Place clean sheets on your bed the night before surgery Use a clean washcloth (not used since being washed) for each shower. DO NOT sleep with pet's night before surgery.  CHG Shower Instructions:  Wash your face and private area with normal soap. If you choose to wash your hair, wash first with your normal shampoo.  After you use shampoo/soap, rinse your hair and body thoroughly to remove shampoo/soap residue.  Turn the water OFF and apply half the bottle of CHG soap to a CLEAN washcloth.  Apply CHG soap ONLY FROM YOUR NECK DOWN TO YOUR TOES (washing for 3-5 minutes)  DO NOT use CHG soap on face, private areas, open wounds, or sores.  Pay special attention to the area where your surgery is being performed.  If you are having back surgery, having someone wash your back for you may be helpful. Wait 2 minutes after CHG soap is applied, then you may rinse off the CHG soap.  Pat dry with a clean towel  Put on clean pajamas    Additional instructions for the day of surgery: DO NOT APPLY any lotions, deodorants, cologne, or perfumes.   Do not wear jewelry or makeup Do not wear nail polish, gel polish, artificial nails, or any other type of covering on natural nails (fingers and toes) Do not bring valuables to the hospital. [facility] is not responsible for valuables/personal belongings. Put on clean/comfortable clothes.  Please brush your teeth.  Ask your nurse before applying any prescription medications to the skin.

## 2023-07-08 NOTE — H&P (View-Only) (Signed)
 Chief Complaint: New Consultation (Gallbladder)   History of Present Illness: Tara Bush is a 33 y.o. female who is seen today as an office consultation at the request of Dr. Ayesha Lente for evaluation of New Consultation (Gallbladder) .  Patient is a 33 year old female, with history of gastric bypass surgery. Patient states that over the last 1 to 2 years she has noticed right upper quadrant pain that radiates up to her chest. States this has been fairly infrequent approximately every 6 months. She states over the last several months it has gotten more and more frequent. She states that the pain is fairly severe. She denies any nausea or vomiting with it.  Patient states that she gained approximately 20 pounds from her lowest weight after bypass surgery.  Patient did undergo ultrasound which revealed multiple gallstones. Patient also with elevated LFTs. I did review her ultrasound and LFTs personally.  Review of Systems: A complete review of systems was obtained from the patient. I have reviewed this information and discussed as appropriate with the patient. See HPI as well for other ROS.  Review of Systems  Constitutional: Negative for fever.  HENT: Negative for congestion.  Eyes: Negative for blurred vision.  Respiratory: Negative for cough, shortness of breath and wheezing.  Cardiovascular: Negative for chest pain and palpitations.  Gastrointestinal: Positive for abdominal pain, nausea and vomiting. Negative for heartburn.  Genitourinary: Negative for dysuria.  Musculoskeletal: Negative for myalgias.  Skin: Negative for rash.  Neurological: Negative for dizziness and headaches.  Psychiatric/Behavioral: Negative for depression and suicidal ideas.  All other systems reviewed and are negative.   Medical History: Past Medical History:  Diagnosis Date  Anxiety   Patient Active Problem List  Diagnosis  Gastric bypass status for obesity  Hypertension  Lipodystrophy  Morbid  obesity (CMS/HHS-HCC)  Papanicolaou smear of cervix with positive high risk human papilloma virus (HPV) test  PCOS (polycystic ovarian syndrome)   Past Surgical History:  Procedure Laterality Date  LAPAROSCOPIC GASTRIC BYPASS N/A 01/25/2019  preop weight 413 lbs    No Known Allergies  Current Outpatient Medications on File Prior to Visit  Medication Sig Dispense Refill  buPROPion (WELLBUTRIN XL) 150 MG XL tablet TAKE 1 TABLET BY MOUTH DAILY IN THE MORNING FOR 1week, THEN TAKE 2 TABLETS BY MOUTH DAILY IN THE MORNING  calcium carbonate 500 mg calcium (1,250 mg) tablet Take by mouth  multivit-min/iron/folic acid/K (BARIATRIC MULTIVITAMINS ORAL) Take by mouth  sertraline  (ZOLOFT ) 50 MG tablet Take 50 mg by mouth once daily  spironolactone (ALDACTONE) 25 MG tablet Take 25 mg by mouth once daily (Patient not taking: Reported on 11/15/2022)   No current facility-administered medications on file prior to visit.   Family History  Problem Relation Age of Onset  Stroke Mother  Obesity Mother  High blood pressure (Hypertension) Mother  Diabetes Mother  Obesity Father  High blood pressure (Hypertension) Father  Diabetes Father  Obesity Sister    Social History   Tobacco Use  Smoking Status Never  Smokeless Tobacco Never    Social History   Socioeconomic History  Marital status: Married  Tobacco Use  Smoking status: Never  Smokeless tobacco: Never  Vaping Use  Vaping status: Never Used  Substance and Sexual Activity  Alcohol use: Yes  Drug use: Never   Social Determinants of Health   Financial Resource Strain: Low Risk (07/03/2022)  Received from [facility]  Overall Financial Resource Strain (CARDIA)  Difficulty of Paying Living Expenses: Not hard at all  Food  Insecurity: No Food Insecurity (07/03/2022)  Received from [facility]  Hunger Vital Sign  Worried About Running Out of Food in the Last Year: Never true  Ran Out of Food in the Last Year: Never true   Transportation Needs: No Transportation Needs (07/03/2022)  Received from [facility] - Transportation  Lack of Transportation (Medical): No  Lack of Transportation (Non-Medical): No  Physical Activity: Insufficiently Active (07/03/2022)  Received from [facility]  Exercise Vital Sign  Days of Exercise per Week: 2 days  Minutes of Exercise per Session: 30 min  Stress: Stress Concern Present (07/03/2022)  Received from [facility] Occupational [facility] - Occupational Stress Questionnaire  Feeling of Stress : Rather much  Social Connections: Socially Isolated (07/03/2022)  Received from [facility]  Social Connection and Isolation Panel [NHANES]  Frequency of Communication with Friends and Family: Once a week  Frequency of Social Gatherings with Friends and Family: Once a week  Attends Religious Services: Never  Database administrator or Organizations: No  Attends Banker Meetings: Never  Marital Status: Married   Objective:   Vitals:  11/15/22 0935 11/15/22 0937  Weight: (!) 113.2 kg (249 lb 9.6 oz)  Height: 170.2 cm (5\' 7" )  PainSc: 0-No pain   Body mass index is 39.09 kg/m.  Physical Exam Constitutional:  Appearance: Normal appearance.  HENT:  Head: Normocephalic and atraumatic.  Mouth/Throat:  Mouth: Mucous membranes are moist.  Pharynx: Oropharynx is clear.  Eyes:  General: No scleral icterus. Pupils: Pupils are equal, round, and reactive to light.  Cardiovascular:  Rate and Rhythm: Normal rate and regular rhythm.  Pulses: Normal pulses.  Heart sounds: No murmur heard. No friction rub. No gallop.  Pulmonary:  Effort: Pulmonary effort is normal. No respiratory distress.  Breath sounds: Normal breath sounds. No stridor.  Abdominal:  General: Abdomen is flat.  Musculoskeletal:  General: No swelling.  Skin: General: Skin is warm.  Neurological:  General: No focal deficit present.  Mental Status: She is alert and  oriented to person, place, and time. Mental status is at baseline.  Psychiatric:  Mood and Affect: Mood normal.  Thought Content: Thought content normal.  Judgment: Judgment normal.     Assessment and Plan:  Diagnoses and all orders for this visit:  Symptomatic cholelithiasis   Tara Bush is a 33 y.o. female   We will proceed to the OR for a lap cholecystectomy. All risks and benefits were discussed with the patient to generally include: infection, bleeding, possible need for post op ERCP, damage to the bile ducts, and bile leak. Alternatives were offered and described. All questions were answered and the patient voiced understanding of the procedure and wishes to proceed at this point with a laparoscopic cholecystectomy  No follow-ups on file.  Shela Derby, MD, [facility], [place] [facility]

## 2023-07-09 NOTE — Anesthesia Preprocedure Evaluation (Addendum)
 Anesthesia Evaluation  Patient identified by MRN, date of birth, ID band Patient awake    Reviewed: Allergy & Precautions, NPO status , Patient's Chart, lab work & pertinent test results  Airway Mallampati: II  TM Distance: >3 FB Neck ROM: Full    Dental  (+) Dental Advisory Given, Caps   Pulmonary neg pulmonary ROS   Pulmonary exam normal breath sounds clear to auscultation       Cardiovascular hypertension, Normal cardiovascular exam Rhythm:Regular Rate:Normal     Neuro/Psych  PSYCHIATRIC DISORDERS Anxiety     Hx/o PTSD   GI/Hepatic Neg liver ROS,,,Symptomatic cholelithiasis S/P Roux en Y gastric bypass 2020   Endo/Other    Class 3 obesity  Renal/GU negative Renal ROS  negative genitourinary   Musculoskeletal negative musculoskeletal ROS (+)    Abdominal  (+) + obese  Peds  Hematology negative hematology ROS (+)   Anesthesia Other Findings   Reproductive/Obstetrics                              Anesthesia Physical Anesthesia Plan  ASA: 3  Anesthesia Plan: General   Post-op Pain Management: Dilaudid IV and Tylenol  PO (pre-op)*   Induction: Intravenous and Cricoid pressure planned  PONV Risk Score and Plan: 4 or greater and Treatment may vary due to age or medical condition, Ondansetron , Dexamethasone , Midazolam  and Scopolamine  patch - Pre-op  Airway Management Planned: Oral ETT  Additional Equipment: None  Intra-op Plan:   Post-operative Plan: Extubation in OR  Informed Consent: I have reviewed the patients History and Physical, chart, labs and discussed the procedure including the risks, benefits and alternatives for the proposed anesthesia with the patient or authorized representative who has indicated his/her understanding and acceptance.     Dental advisory given  Plan Discussed with: CRNA and Anesthesiologist  Anesthesia Plan Comments: (PAT note written 07/09/2023  by Allison Zelenak, PA-C.  )        Anesthesia Quick Evaluation

## 2023-07-09 NOTE — Progress Notes (Signed)
 Anesthesia Chart Review:  Case: [id number] Date/Time: 07/15/23 0715   Procedure: LAPAROSCOPIC CHOLECYSTECTOMY - LATERALITY: NA   Anesthesia type: General   Diagnosis: Symptomatic cholelithiasis [K80.20]   Pre-op diagnosis: GALLSTONES   [facility] ROOM [facility] / [facility]   Surgeons: Shela Derby, MD       DISCUSSION: Patient is a 33 year old female scheduled for the above procedure for symptomatic cholelithiasis.   Other history includes never smoker, PTSD, ADHD, morbid obesity (s/p Gastric Roux-en-Y 01/25/19).   At PAT visit, she reported recently waking up with sore throat and pressure behind her eyes, but symptoms resolve once upright and able to clear her sinuses. She denied feeling sick or having fever. She felt symptoms were allergy related. She was advised to notify PAT and surgeon if she develops any sick symptoms as elective surgery would likely require postponement for at least 2 weeks. CBC normal.   Anesthesia team to evaluate on the day of surgery.   VS: BP 125/64   Pulse 69   Temp 36.8 C   Resp 18   Ht 5\' 6"  (1.676 m)   Wt 125.6 kg   LMP 06/24/2023 (Approximate)   SpO2 99%   BMI 44.71 kg/m   PROVIDERS: Stamey, Lowell Rude, FNP is PCP    LABS: Labs reviewed: Acceptable for surgery. CBC normal. CMP obtained due to elevated LFTs 09/05/22 [facility]) with AST 55 and ALT 297, alk phose 172. LFTs have since normalized.   (all labs ordered are listed, but only abnormal results are displayed)  Labs Reviewed  COMPREHENSIVE METABOLIC PANEL WITH GFR - Abnormal; Notable for the following components:      Result Value   Glucose, Bld 105 (*)    All other components within normal limits  CBC     IMAGES: US  Abd (RUQ) 10/27/22: IMPRESSION: Cholelithiasis without sonographic evidence of acute cholecystitis.    EKG: EKG 11/06/18: Normal sinus rhythm Cannot rule out Anterior infarct , age undetermined Abnormal ECG Confirmed by Artemisa Bile [phone]) on 11/08/2018 12:56:21  PM   CV: N/A  Past Medical History:  Diagnosis Date   ADHD (attention deficit hyperactivity disorder)    Dysfunctional uterine bleeding    Obesity    Papanicolaou smear of cervix with positive high risk human papilloma virus (HPV) test 06/12/2020   06/12/2020 repeat pap in 1 year 5 year risk CIN 3+ 4.8%   Post traumatic stress disorder (PTSD)     Past Surgical History:  Procedure Laterality Date   GASTRIC ROUX-EN-Y N/A 01/25/2019   Procedure: LAPAROSCOPIC ROUX-EN-Y GASTRIC BYPASS WITH UPPER ENDOSCOPY, ERAS Pathway;  Surgeon: Jacolyn Matar, MD;  Location: [facility] ORS;  Service: General;  Laterality: N/A;   TONSILLECTOMY AND ADENOIDECTOMY     As a child    MEDICATIONS:  acetaminophen  (TYLENOL ) 500 MG tablet   atomoxetine (STRATTERA) 40 MG capsule   azithromycin  (ZITHROMAX ) 250 MG tablet   calcium carbonate (OS-CAL - DOSED IN MG OF ELEMENTAL CALCIUM) 1250 (500 Ca) MG tablet   misoprostol  (CYTOTEC ) 200 MCG tablet   Multiple Vitamins-Minerals (BARIATRIC FUSION) CHEW   ondansetron  (ZOFRAN -ODT) 4 MG disintegrating tablet   No current facility-administered medications for this encounter.   She is not currently taking Cytotec , Zithromax , Zofran .   Ella Gun, PA-C Surgical Short Stay/Anesthesiology [facility] Phone [phone] [facility] Phone [phone] 07/09/2023 6:07 PM

## 2023-07-14 ENCOUNTER — Encounter (HOSPITAL_COMMUNITY): Payer: Self-pay | Admitting: General Surgery

## 2023-07-14 ENCOUNTER — Ambulatory Visit: Admitting: Obstetrics & Gynecology

## 2023-07-14 ENCOUNTER — Encounter: Payer: Self-pay | Admitting: Obstetrics & Gynecology

## 2023-07-14 VITALS — BP 136/84 | HR 86 | Ht 66.0 in | Wt 277.8 lb

## 2023-07-14 DIAGNOSIS — F419 Anxiety disorder, unspecified: Secondary | ICD-10-CM

## 2023-07-14 DIAGNOSIS — Z01419 Encounter for gynecological examination (general) (routine) without abnormal findings: Secondary | ICD-10-CM

## 2023-07-14 DIAGNOSIS — Z975 Presence of (intrauterine) contraceptive device: Secondary | ICD-10-CM | POA: Diagnosis not present

## 2023-07-14 MED ORDER — SERTRALINE HCL 50 MG PO TABS: 50.0000 mg | ORAL_TABLET | Freq: Every day | ORAL | 4 refills | Status: AC

## 2023-07-14 NOTE — Progress Notes (Signed)
 WELL-WOMAN EXAMINATION Patient name: Tara Bush MRN [id number]  Date of birth: 10/16/1990 Chief Complaint:   Gynecologic Exam  History of Present Illness:   Tara Bush is a 33 y.o. [id number]  female being seen today for a routine well-woman exam.   Menses are mostly regular each month- denies HMB or dysmenorrhea.  Had one episode of irregular spotting, but only once.  She has noted some acne and chin facial hair- concerned about hormonal changes.  Mirena  placed May 2024.  Overall doing well with this form of contraception and only notes symptoms as above  Scheduled for cholecystectomy tomorrow   Patient's last menstrual period was 06/24/2023 (approximate). Denies issues with her menses The current method of family planning is IUD.    Last pap 06/2021.  Last mammogram: NA. Last colonoscopy: NA     07/14/2023    8:37 AM 06/25/2021    8:41 AM 01/12/2021   11:02 AM 09/25/2020    8:35 AM 06/07/2020    8:49 AM  Depression screen PHQ 2/9  Decreased Interest 1 0 0 0 1  Down, Depressed, Hopeless 1 0 0 0 0  PHQ - 2 Score 2 0 0 0 1  Altered sleeping 1 0 0 1 1  Tired, decreased energy 1 1 1  0 1  Change in appetite 2 1 0 0 0  Feeling bad or failure about yourself  0 0 0 0 1  Trouble concentrating 1 0 0 0 0  Moving slowly or fidgety/restless 0 0 0 0 0  Suicidal thoughts 0 0 0 0 0  PHQ-9 Score 7 2 1 1 4       Review of Systems:   Pertinent items are noted in HPI Denies any headaches, blurred vision, fatigue, shortness of breath, chest pain, abdominal pain, bowel movements, urination, or intercourse unless otherwise stated above.  Pertinent History Reviewed:  Reviewed past medical,surgical, social and family history.  Reviewed problem list, medications and allergies. Physical Assessment:   Vitals:   07/14/23 0835  BP: 136/84  Pulse: 86  Weight: 277 lb 12.8 oz (126 kg)  Height: 5\' 6"  (1.676 m)  Body mass index is 44.84 kg/m.        Physical Examination:    General appearance - well appearing, and in no distress  Mental status - alert, oriented to person, place, and time  Psych:  She has a normal mood and affect  Skin - warm and dry, normal color, no suspicious lesions noted  Chest - effort normal, all lung fields clear to auscultation bilaterally  Heart - normal rate and regular rhythm  Neck:  midline trachea, no thyromegaly or nodules  Breasts - breasts appear normal, no suspicious masses, no skin or nipple changes or  axillary nodes  Abdomen - obese, soft, nontender, nondistended, no masses or organomegaly  Pelvic - VULVA: normal appearing vulva with no masses, tenderness or lesions  VAGINA: normal appearing vagina with normal color and discharge, no lesions  CERVIX: normal appearing cervix without discharge or lesions, no CMT Strings visualized at os  UTERUS: uterus is felt to be normal size, shape, consistency and nontender   ADNEXA: No adnexal masses or tenderness noted.   Extremities:  No swelling or varicosities noted  Chaperone: pt declined     Assessment & Plan:  1) Well-Woman Exam -pap up to date, reviewed screening guidelines  2) Contraception -Mirena  in place -reassured pt that indeed some of her symptoms are likely related to IUD, discussed options such  as removal.  Reviewed questions regarding "checking levels" and discussed that normal is a wide range and lab work would not change management -after discussion, plan to continue with Mirena   3) anxiety -doing therapy weekly and notes improvement -doing well with zoloft  and plan to continue  No orders of the defined types were placed in this encounter.   Meds:  Meds ordered this encounter  Medications   sertraline  (ZOLOFT ) 50 MG tablet    Sig: Take 1 tablet (50 mg total) by mouth daily.    Dispense:  90 tablet    Refill:  4    Follow-up: Return in about 1 year (around 07/13/2024) for Annual.   Louay Myrie, DO Attending Obstetrician & Gynecologist, Faculty  [facility] for [facility], [facility]

## 2023-07-15 ENCOUNTER — Encounter (HOSPITAL_COMMUNITY)
Admission: RE | Disposition: A | Discharge status: Home / Self Care | Payer: Self-pay | Source: Home / Self Care | Attending: General Surgery

## 2023-07-15 ENCOUNTER — Ambulatory Visit (HOSPITAL_COMMUNITY)
Admission: RE | Admit: 2023-07-15 | Discharge: 2023-07-15 | Disposition: A | Discharge status: Home / Self Care | Attending: General Surgery | Admitting: General Surgery

## 2023-07-15 ENCOUNTER — Encounter (HOSPITAL_COMMUNITY): Payer: Self-pay | Admitting: General Surgery

## 2023-07-15 ENCOUNTER — Ambulatory Visit (HOSPITAL_COMMUNITY): Payer: Self-pay | Admitting: Vascular Surgery

## 2023-07-15 ENCOUNTER — Ambulatory Visit (HOSPITAL_COMMUNITY)

## 2023-07-15 ENCOUNTER — Other Ambulatory Visit: Payer: Self-pay

## 2023-07-15 DIAGNOSIS — I10 Essential (primary) hypertension: Secondary | ICD-10-CM | POA: Diagnosis not present

## 2023-07-15 DIAGNOSIS — Z6838 Body mass index (BMI) 38.0-38.9, adult: Secondary | ICD-10-CM | POA: Diagnosis not present

## 2023-07-15 DIAGNOSIS — F419 Anxiety disorder, unspecified: Secondary | ICD-10-CM | POA: Diagnosis not present

## 2023-07-15 DIAGNOSIS — Z9884 Bariatric surgery status: Secondary | ICD-10-CM | POA: Insufficient documentation

## 2023-07-15 DIAGNOSIS — E66813 Obesity, class 3: Secondary | ICD-10-CM | POA: Insufficient documentation

## 2023-07-15 DIAGNOSIS — K8010 Calculus of gallbladder with chronic cholecystitis without obstruction: Secondary | ICD-10-CM | POA: Diagnosis not present

## 2023-07-15 DIAGNOSIS — Z01818 Encounter for other preprocedural examination: Secondary | ICD-10-CM

## 2023-07-15 HISTORY — PX: CHOLECYSTECTOMY: SHX55

## 2023-07-15 LAB — POCT PREGNANCY, URINE: Preg Test, Ur: NEGATIVE

## 2023-07-15 SURGERY — LAPAROSCOPIC CHOLECYSTECTOMY
Anesthesia: General

## 2023-07-15 MED ORDER — MIDAZOLAM HCL 2 MG/2ML IJ SOLN
INTRAMUSCULAR | Status: DC | PRN
  Administered 2023-07-15: 2 mg via INTRAVENOUS

## 2023-07-15 MED ORDER — DROPERIDOL 2.5 MG/ML IJ SOLN
INTRAMUSCULAR | Status: AC
  Filled 2023-07-15: qty 2

## 2023-07-15 MED ORDER — BUPIVACAINE HCL 0.25 % IJ SOLN
INTRAMUSCULAR | Status: DC | PRN
  Administered 2023-07-15: 8 mL

## 2023-07-15 MED ORDER — ROCURONIUM BROMIDE 10 MG/ML (PF) SYRINGE
PREFILLED_SYRINGE | INTRAVENOUS | Status: DC | PRN
  Administered 2023-07-15: 80 mg via INTRAVENOUS

## 2023-07-15 MED ORDER — OXYCODONE HCL 5 MG PO TABS: 5.0000 mg | ORAL_TABLET | Freq: Once | ORAL | Status: AC | PRN

## 2023-07-15 MED ORDER — LIDOCAINE 2% (20 MG/ML) 5 ML SYRINGE
INTRAMUSCULAR | Status: DC | PRN
  Administered 2023-07-15: 100 mg via INTRAVENOUS

## 2023-07-15 MED ORDER — SUGAMMADEX SODIUM 200 MG/2ML IV SOLN
INTRAVENOUS | Status: DC | PRN
  Administered 2023-07-15: 200 mg via INTRAVENOUS

## 2023-07-15 MED ORDER — LIDOCAINE 2% (20 MG/ML) 5 ML SYRINGE
INTRAMUSCULAR | Status: AC
Start: 2023-07-15 — End: ?
  Filled 2023-07-15: qty 5

## 2023-07-15 MED ORDER — OXYCODONE HCL 5 MG/5ML PO SOLN
5.0000 mg | Freq: Once | ORAL | Status: AC | PRN
  Administered 2023-07-15: 5 mg via ORAL

## 2023-07-15 MED ORDER — BUPIVACAINE HCL (PF) 0.25 % IJ SOLN
INTRAMUSCULAR | Status: AC
Start: 2023-07-15 — End: ?
  Filled 2023-07-15: qty 30

## 2023-07-15 MED ORDER — CEFAZOLIN SODIUM-DEXTROSE 3-4 GM/150ML-% IV SOLN
3.0000 g | INTRAVENOUS | Status: AC
  Administered 2023-07-15: 3 g via INTRAVENOUS
  Filled 2023-07-15: qty 150

## 2023-07-15 MED ORDER — ONDANSETRON HCL 4 MG/2ML IJ SOLN: 4.0000 mg | Freq: Once | INTRAMUSCULAR | Status: DC | PRN

## 2023-07-15 MED ORDER — ACETAMINOPHEN 500 MG PO TABS
1000.0000 mg | ORAL_TABLET | ORAL | Status: DC
  Filled 2023-07-15: qty 2

## 2023-07-15 MED ORDER — ONDANSETRON HCL 4 MG/2ML IJ SOLN
INTRAMUSCULAR | Status: AC
Start: 2023-07-15 — End: ?
  Filled 2023-07-15: qty 2

## 2023-07-15 MED ORDER — DEXAMETHASONE SODIUM PHOSPHATE 10 MG/ML IJ SOLN
INTRAMUSCULAR | Status: AC
  Filled 2023-07-15: qty 1

## 2023-07-15 MED ORDER — PROPOFOL 10 MG/ML IV BOLUS
INTRAVENOUS | Status: DC | PRN
  Administered 2023-07-15: 200 mg via INTRAVENOUS

## 2023-07-15 MED ORDER — HYDROMORPHONE HCL 1 MG/ML IJ SOLN
INTRAMUSCULAR | Status: AC
  Filled 2023-07-15: qty 1

## 2023-07-15 MED ORDER — CHLORHEXIDINE GLUCONATE 0.12 % MT SOLN
15.0000 mL | Freq: Once | OROMUCOSAL | Status: AC
  Administered 2023-07-15: 15 mL via OROMUCOSAL
  Filled 2023-07-15: qty 15

## 2023-07-15 MED ORDER — ROCURONIUM BROMIDE 10 MG/ML (PF) SYRINGE
PREFILLED_SYRINGE | INTRAVENOUS | Status: AC
  Filled 2023-07-15: qty 10

## 2023-07-15 MED ORDER — ORAL CARE MOUTH RINSE: 15.0000 mL | Freq: Once | OROMUCOSAL | Status: AC

## 2023-07-15 MED ORDER — FENTANYL CITRATE (PF) 250 MCG/5ML IJ SOLN
INTRAMUSCULAR | Status: AC
  Filled 2023-07-15: qty 5

## 2023-07-15 MED ORDER — HYDROMORPHONE HCL 1 MG/ML IJ SOLN
0.2500 mg | INTRAMUSCULAR | Status: DC | PRN
  Administered 2023-07-15 (×3): 0.5 mg via INTRAVENOUS

## 2023-07-15 MED ORDER — SCOPOLAMINE 1 MG/3DAYS TD PT72: 1.0000 | MEDICATED_PATCH | TRANSDERMAL | Status: DC

## 2023-07-15 MED ORDER — 0.9 % SODIUM CHLORIDE (POUR BTL) OPTIME
TOPICAL | Status: DC | PRN
  Administered 2023-07-15: 1000 mL

## 2023-07-15 MED ORDER — ONDANSETRON HCL 4 MG/2ML IJ SOLN
INTRAMUSCULAR | Status: DC | PRN
  Administered 2023-07-15: 4 mg via INTRAVENOUS

## 2023-07-15 MED ORDER — DEXAMETHASONE SODIUM PHOSPHATE 10 MG/ML IJ SOLN
INTRAMUSCULAR | Status: DC | PRN
  Administered 2023-07-15: 10 mg via INTRAVENOUS

## 2023-07-15 MED ORDER — LACTATED RINGERS IV SOLN: INTRAVENOUS | Status: DC

## 2023-07-15 MED ORDER — DROPERIDOL 2.5 MG/ML IJ SOLN
0.6250 mg | Freq: Once | INTRAMUSCULAR | Status: AC | PRN
  Administered 2023-07-15: 0.625 mg via INTRAVENOUS

## 2023-07-15 MED ORDER — MIDAZOLAM HCL 2 MG/2ML IJ SOLN
INTRAMUSCULAR | Status: AC
Start: 2023-07-15 — End: ?
  Filled 2023-07-15: qty 2

## 2023-07-15 MED ORDER — CHLORHEXIDINE GLUCONATE CLOTH 2 % EX PADS: 6.0000 | MEDICATED_PAD | Freq: Once | CUTANEOUS | Status: DC

## 2023-07-15 MED ORDER — ENSURE PRE-SURGERY PO LIQD: 296.0000 mL | Freq: Once | ORAL | Status: DC

## 2023-07-15 MED ORDER — OXYCODONE HCL 5 MG/5ML PO SOLN
ORAL | Status: AC
  Filled 2023-07-15: qty 5

## 2023-07-15 MED ORDER — PROPOFOL 10 MG/ML IV BOLUS
INTRAVENOUS | Status: AC
  Filled 2023-07-15: qty 20

## 2023-07-15 MED ORDER — SODIUM CHLORIDE 0.9 % IR SOLN
Status: DC | PRN
  Administered 2023-07-15: 1000 mL

## 2023-07-15 MED ORDER — TRAMADOL HCL 50 MG PO TABS: 50.0000 mg | ORAL_TABLET | Freq: Four times a day (QID) | ORAL | 0 refills | Status: AC | PRN

## 2023-07-15 MED ORDER — FENTANYL CITRATE (PF) 250 MCG/5ML IJ SOLN
INTRAMUSCULAR | Status: DC | PRN
  Administered 2023-07-15: 100 ug via INTRAVENOUS
  Administered 2023-07-15: 150 ug via INTRAVENOUS

## 2023-07-15 SURGICAL SUPPLY — 34 items
BAG COUNTER SPONGE SURGICOUNT (BAG) ×1 IMPLANT
CANISTER SUCTION 3000ML PPV (SUCTIONS) ×1 IMPLANT
CHLORAPREP W/TINT 26 (MISCELLANEOUS) ×1 IMPLANT
CLIP LIGATING HEMO O LOK GREEN (MISCELLANEOUS) ×1 IMPLANT
COVER SURGICAL LIGHT HANDLE (MISCELLANEOUS) ×1 IMPLANT
COVER TRANSDUCER ULTRASND (DRAPES) ×1 IMPLANT
DERMABOND ADVANCED .7 DNX12 (GAUZE/BANDAGES/DRESSINGS) ×1 IMPLANT
ELECTRODE REM PT RTRN 9FT ADLT (ELECTROSURGICAL) ×1 IMPLANT
GLOVE BIO SURGEON STRL SZ7.5 (GLOVE) ×2 IMPLANT
GOWN STRL REUS W/ TWL LRG LVL3 (GOWN DISPOSABLE) ×2 IMPLANT
GOWN STRL REUS W/ TWL XL LVL3 (GOWN DISPOSABLE) ×1 IMPLANT
GRASPER SUT TROCAR 14GX15 (MISCELLANEOUS) ×1 IMPLANT
IRRIGATION SUCT STRKRFLW 2 WTP (MISCELLANEOUS) ×1 IMPLANT
KIT BASIN OR (CUSTOM PROCEDURE TRAY) ×1 IMPLANT
KIT IMAGING PINPOINTPAQ (MISCELLANEOUS) IMPLANT
KIT TURNOVER KIT B (KITS) ×1 IMPLANT
NDL INSUFFLATION 14GA 120MM (NEEDLE) ×1 IMPLANT
NEEDLE INSUFFLATION 14GA 120MM (NEEDLE) ×1 IMPLANT
NS IRRIG 1000ML POUR BTL (IV SOLUTION) ×1 IMPLANT
PAD ARMBOARD POSITIONER FOAM (MISCELLANEOUS) ×1 IMPLANT
POUCH LAPAROSCOPIC INSTRUMENT (MISCELLANEOUS) ×1 IMPLANT
POUCH RETRIEVAL ECOSAC 10 (ENDOMECHANICALS) IMPLANT
SCISSORS LAP 5X35 DISP (ENDOMECHANICALS) ×1 IMPLANT
SET TUBE SMOKE EVAC HIGH FLOW (TUBING) ×1 IMPLANT
SLEEVE Z-THREAD 5X100MM (TROCAR) ×1 IMPLANT
SPECIMEN JAR SMALL (MISCELLANEOUS) ×1 IMPLANT
SUT MNCRL AB 4-0 PS2 18 (SUTURE) ×1 IMPLANT
TOWEL GREEN STERILE (TOWEL DISPOSABLE) ×1 IMPLANT
TOWEL GREEN STERILE FF (TOWEL DISPOSABLE) ×1 IMPLANT
TRAY LAPAROSCOPIC MC (CUSTOM PROCEDURE TRAY) ×1 IMPLANT
TROCAR 11X100 Z THREAD (TROCAR) ×1 IMPLANT
TROCAR Z-THREAD OPTICAL 5X100M (TROCAR) ×1 IMPLANT
WARMER LAPAROSCOPE (MISCELLANEOUS) ×1 IMPLANT
WATER STERILE IRR 1000ML POUR (IV SOLUTION) ×1 IMPLANT

## 2023-07-15 NOTE — Transfer of Care (Signed)
 Immediate Anesthesia Transfer of Care Note  Patient: Tara Bush  Procedure(s) Performed: LAPAROSCOPIC CHOLECYSTECTOMY  Patient Location: PACU  Anesthesia Type:General  Level of Consciousness: awake, alert , and oriented  Airway & Oxygen Therapy: Patient Spontanous Breathing  Post-op Assessment: Report given to RN and Post -op Vital signs reviewed and stable  Post vital signs: Reviewed and stable  Last Vitals:  Vitals Value Taken Time  BP 156/98 07/15/23 0823  Temp 36.8 C 07/15/23 0823  Pulse 84 07/15/23 0825  Resp 15 07/15/23 0825  SpO2 99 % 07/15/23 0825  Vitals shown include unfiled device data.  Last Pain:  Vitals:   07/15/23 0613  TempSrc:   PainSc: 0-No pain         Complications: No notable events documented.

## 2023-07-15 NOTE — Interval H&P Note (Signed)
 History and Physical Interval Note:  07/15/2023 7:05 AM  Tara Bush  has presented today for surgery, with the diagnosis of GALLSTONES.  The various methods of treatment have been discussed with the patient and family. After consideration of risks, benefits and other options for treatment, the patient has consented to  Procedure(s) with comments: LAPAROSCOPIC CHOLECYSTECTOMY (N/A) - LATERALITY: NA as a surgical intervention.  The patient's history has been reviewed, patient examined, no change in status, stable for surgery.  I have reviewed the patient's chart and labs.  Questions were answered to the patient's satisfaction.     [name]

## 2023-07-15 NOTE — Anesthesia Procedure Notes (Signed)
 Procedure Name: Intubation Date/Time: 07/15/2023 7:33 AM  Performed by: Artemisa Bile D, CRNAPre-anesthesia Checklist: Patient identified, Emergency Drugs available, Suction available and Patient being monitored Patient Re-evaluated:Patient Re-evaluated prior to induction Oxygen Delivery Method: Circle System Utilized Preoxygenation: Pre-oxygenation with 100% oxygen Induction Type: IV induction Ventilation: Mask ventilation without difficulty Laryngoscope Size: Miller and 2 Grade View: Grade I Tube type: Oral Tube size: 7.0 mm Number of attempts: 1 Airway Equipment and Method: Stylet and Oral airway Placement Confirmation: ETT inserted through vocal cords under direct vision, positive ETCO2 and breath sounds checked- equal and bilateral Secured at: 22 cm Tube secured with: Tape Dental Injury: Teeth and Oropharynx as per pre-operative assessment

## 2023-07-15 NOTE — Discharge Instructions (Signed)
 [facility] ______[facility] LAPAROSCOPIC SURGERY: POST OP INSTRUCTIONS Always review your discharge instruction sheet given to you by the facility where your surgery was performed. IF YOU HAVE DISABILITY OR FAMILY LEAVE FORMS, YOU MUST BRING THEM TO THE OFFICE FOR PROCESSING.   DO NOT GIVE THEM TO YOUR DOCTOR.  A prescription for pain medication may be given to you upon discharge.  Take your pain medication as prescribed, if needed.  If narcotic pain medicine is not needed, then you may take acetaminophen (Tylenol) or ibuprofen (Advil) as needed. Take your usually prescribed medications unless otherwise directed. If you need a refill on your pain medication, please contact your pharmacy.  They will contact our office to request authorization. Prescriptions will not be filled after 5pm or on week-ends. You should follow a light diet the first few days after arrival home, such as soup and crackers, etc.  Be sure to include lots of fluids daily. Most patients will experience some swelling and bruising in the area of the incisions.  Ice packs will help.  Swelling and bruising can take several days to resolve.  It is common to experience some constipation if taking pain medication after surgery.  Increasing fluid intake and taking a stool softener (such as Colace) will usually help or prevent this problem from occurring.  A mild laxative (Milk of Magnesia or Miralax) should be taken according to package instructions if there are no bowel movements after 48 hours. Unless discharge instructions indicate otherwise, you may remove your bandages 24-48 hours after surgery, and you may shower at that time.  You may have steri-strips (small skin tapes) in place directly over the incision.  These strips should be left on the skin for 7-10 days.  If your surgeon used skin glue on the incision, you may shower in 24 hours.  The glue will flake off over the next 2-3 weeks.  Any sutures or staples will be  removed at the office during your follow-up visit. ACTIVITIES:  You may resume regular (light) daily activities beginning the next day--such as daily self-care, walking, climbing stairs--gradually increasing activities as tolerated.  You may have sexual intercourse when it is comfortable.  Refrain from any heavy lifting or straining until approved by your doctor. You may drive when you are no longer taking prescription pain medication, you can comfortably wear a seatbelt, and you can safely maneuver your car and apply brakes. RETURN TO WORK:  __________________________________________________________ Tara Bush should see your doctor in the office for a follow-up appointment approximately 2-3 weeks after your surgery.  Make sure that you call for this appointment within a day or two after you arrive home to insure a convenient appointment time. OTHER INSTRUCTIONS: __________________________________________________________________________________________________________________________ __________________________________________________________________________________________________________________________ WHEN TO CALL YOUR DOCTOR: Fever over 101.0 Inability to urinate Continued bleeding from incision. Increased pain, redness, or drainage from the incision. Increasing abdominal pain  The clinic staff is available to answer your questions during regular business hours.  Please don't hesitate to call and ask to speak to one of the nurses for clinical concerns.  If you have a medical emergency, go to the nearest emergency room or call 911.  A surgeon from [facility] is always on call at the hospital. [address], [address], [place], [place]  [address] ? [address] [address], [place], [place]   [address] [phone] ? [phone] ? FAX [phone] Web site: [email or web address]

## 2023-07-15 NOTE — Op Note (Signed)
 07/15/2023  8:14 AM  PATIENT:  Tara Bush  33 y.o. female  PRE-OPERATIVE DIAGNOSIS:  GALLSTONES  POST-OPERATIVE DIAGNOSIS:  GALLSTONES, chronic cholecystitis  PROCEDURE:  Procedure(s) with comments: LAPAROSCOPIC CHOLECYSTECTOMY (N/A) - LATERALITY: NA  SURGEON:  Surgeons and Role:    Shela Derby, MD - Primary   ASSISTANTS: none   ANESTHESIA:   local and general  EBL:  minimal   BLOOD ADMINISTERED:none  DRAINS: none   LOCAL MEDICATIONS USED:  BUPIVICAINE   SPECIMEN:  Source of Specimen:  gallbladder  DISPOSITION OF SPECIMEN:  PATHOLOGY  COUNTS:  YES  TOURNIQUET:  * No tourniquets in log *  DICTATION: .Dragon Dictation   EBL: <5cc   Complications: none   Counts: reported as correct x 2   Findings:chronic inflammation of gallbladder with gallstones  Indications for procedure: Pt is a 73F with RUQ pain and seen to have gallstones.   Details of the procedure: The patient was taken to the operating and placed in the supine position with bilateral SCDs in place. A time out was called and all facts were verified. A pneumoperitoneum was obtained via A Veress needle technique to a pressure of 14mm of mercury. A 5mm trochar was then placed in the right upper quadrant under visualization, and there were no injuries to any abdominal organs. A 11 mm port was then placed in the umbilical region after infiltrating with local anesthesia under direct visualization. A second epigastric port was placed under direct visualization.   The gallbladder was identified and retracted, the peritoneum was then sharply dissected from the gallbladder and this dissection was carried down to Calot's triangle. The cystic duct was identified and dissected circumferentially and seen going into the gallbladder 360.  The cystic artery was dissected away from the surrounding tissues.   The critical angle was obtained.  A Cook catheter was used to perform an intraoperative cholangiogram. The  cystic duct and common bile duct were seen free of filling defects.   2 clips were placed proximally one distally and the cystic duct transected. The cystic artery was identified and 2 clips placed proximally and one distally and transected. We then proceeded to remove the gallbladder off the hepatic fossa with Bovie cautery. A retrieval bag was then placed in the abdomen and gallbladder placed in the bag. The hepatic fossa was then reexamined and hemostasis was achieved with Bovie cautery and was excellent at this portion of the case. The subhepatic fossa and perihepatic fossa was then irrigated until the effluent was clear. The specimen bag and specimen were removed from the abdominal cavity.  The 11 mm trocar fascia was reapproximated with the Endo Close #1 Vicryl x2. The pneumoperitoneum was evacuated and all trochars removed under direct visulalization. The skin was then closed with 4-0 Monocryl and the skin dressed with Dermabond. The patient was awaken from general anesthesia and taken to the recovery room in stable condition.   PLAN OF CARE: Discharge to home after PACU  PATIENT DISPOSITION:  PACU - hemodynamically stable.   Delay start of Pharmacological VTE agent (>24hrs) due to surgical blood loss or risk of bleeding: not applicable

## 2023-07-15 NOTE — Anesthesia Postprocedure Evaluation (Signed)
 Anesthesia Post Note  Patient: Tara Bush  Procedure(s) Performed: LAPAROSCOPIC CHOLECYSTECTOMY     Patient location during evaluation: PACU Anesthesia Type: General Level of consciousness: awake and alert and oriented Pain management: pain level controlled Vital Signs Assessment: post-procedure vital signs reviewed and stable Respiratory status: spontaneous breathing, nonlabored ventilation and respiratory function stable Cardiovascular status: blood pressure returned to baseline and stable Postop Assessment: no apparent nausea or vomiting Anesthetic complications: no   No notable events documented.  Last Vitals:  Vitals:   07/15/23 0845 07/15/23 0900  BP: (!) 148/81 (!) 157/83  Pulse: 63 64  Resp: 13 12  Temp:    SpO2: 99% 100%    Last Pain:  Vitals:   07/15/23 0823  TempSrc:   PainSc: 0-No pain                 Wyatt Thorstenson A.

## 2023-07-16 ENCOUNTER — Encounter (HOSPITAL_COMMUNITY): Payer: Self-pay | Admitting: General Surgery

## 2023-07-16 LAB — SURGICAL PATHOLOGY

## 2023-07-29 DIAGNOSIS — F4312 Post-traumatic stress disorder, chronic: Secondary | ICD-10-CM | POA: Diagnosis not present

## 2023-08-29 ENCOUNTER — Institutional Professional Consult (permissible substitution): Admitting: Plastic Surgery

## 2023-09-12 DIAGNOSIS — E538 Deficiency of other specified B group vitamins: Secondary | ICD-10-CM | POA: Diagnosis not present

## 2023-09-12 DIAGNOSIS — F419 Anxiety disorder, unspecified: Secondary | ICD-10-CM | POA: Diagnosis not present

## 2023-09-12 DIAGNOSIS — Z6841 Body Mass Index (BMI) 40.0 and over, adult: Secondary | ICD-10-CM | POA: Diagnosis not present

## 2023-09-12 DIAGNOSIS — L680 Hirsutism: Secondary | ICD-10-CM | POA: Diagnosis not present

## 2023-09-12 DIAGNOSIS — F988 Other specified behavioral and emotional disorders with onset usually occurring in childhood and adolescence: Secondary | ICD-10-CM | POA: Diagnosis not present

## 2023-09-12 DIAGNOSIS — E559 Vitamin D deficiency, unspecified: Secondary | ICD-10-CM | POA: Diagnosis not present

## 2023-09-19 ENCOUNTER — Encounter: Payer: Self-pay | Admitting: Obstetrics & Gynecology

## 2023-12-22 ENCOUNTER — Encounter: Payer: Self-pay | Admitting: Obstetrics & Gynecology

## 2023-12-22 MED ORDER — ONDANSETRON 4 MG PO TBDP: 4.0000 mg | ORAL_TABLET | Freq: Four times a day (QID) | ORAL | 1 refills | Status: AC | PRN

## 2023-12-24 IMAGING — RF DG HYSTEROGRAM
7 series · 14 of 18 positions shown · IV contrast (omnipaque)
Comparison: NONE.

CLINICAL DATA: Infertility.

EXAM:
HYSTEROSALPINGOGRAM
TECHNIQUE: Following cleansing of the cervix and vagina, a hysterosalpingogram
was performed using a 5 French hysterosalpingogram catheter and
Omnipaque 300 contrast. The patient tolerated the exam without
difficulty. This exam was performed by Billiot, Dalvin, and
was supervised and interpreted by Dr. Moise Franz Godefroid.
FLUOROSCOPY TIME:  Radiation Exposure Index (as provided by the
fluoroscopic device): 18.2 mGy

[Series 1: one shot · 2 of 3 slices shown (1 of 3)]
[im 1/3]
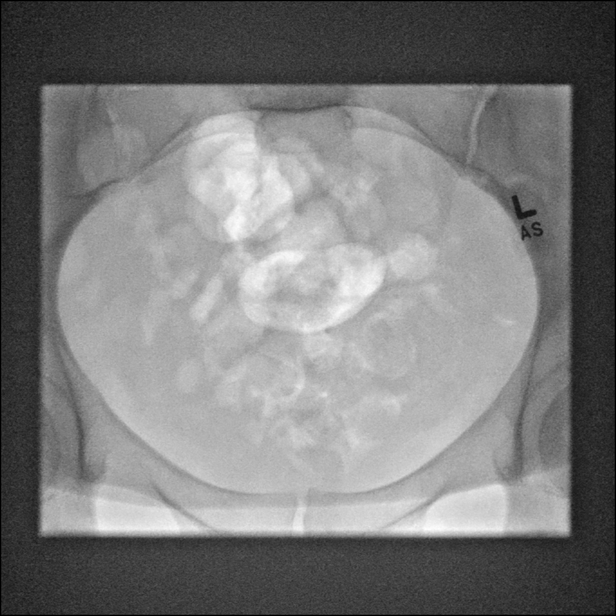
[im 2/3]
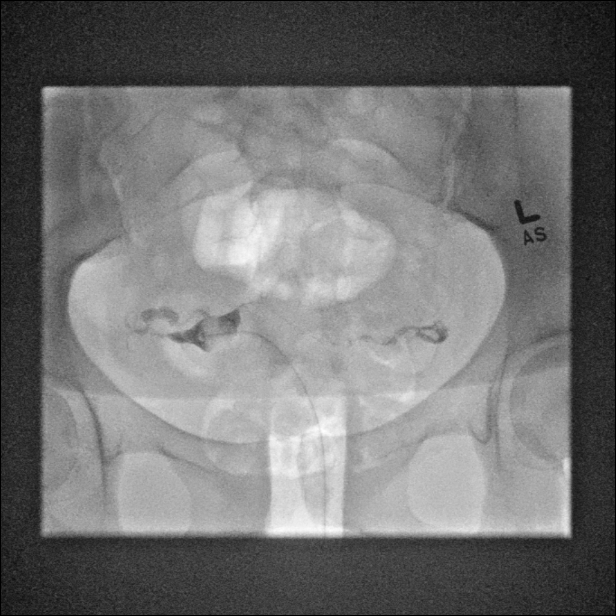

[Series 2: sequence · 3 of 21 frames shown (1 of 4)]
[frame 4/21]
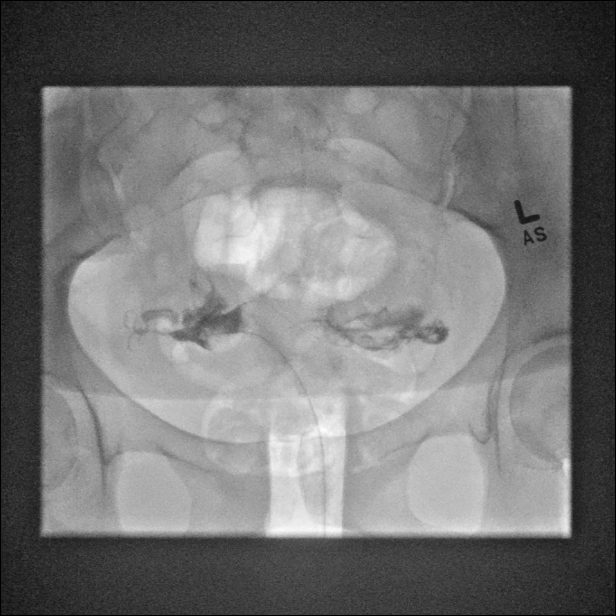
[frame 8/21]
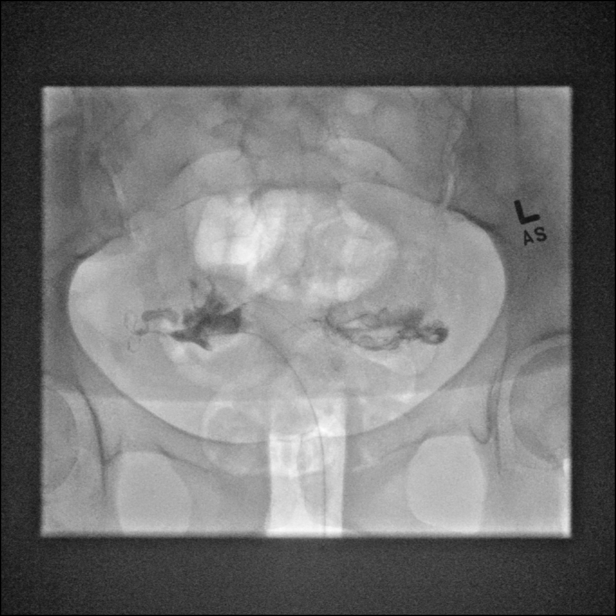
[frame 11/21]
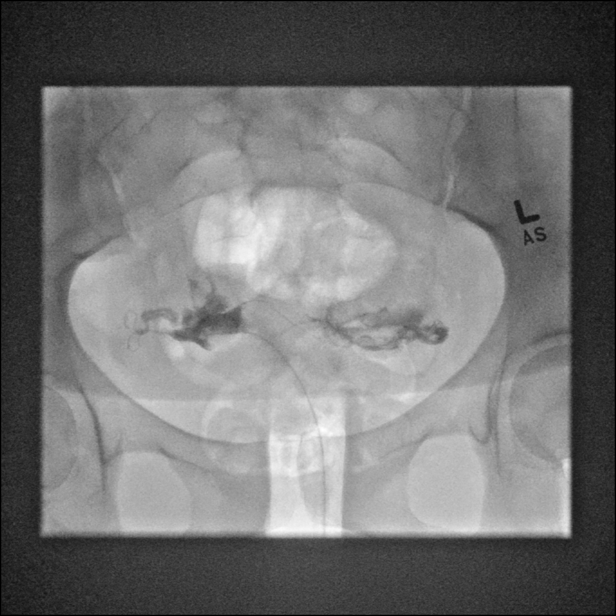

[Series 3: one shot · 0.15mm/px · 3 of 3 slices shown (2 of 3)]
[im 1/3]
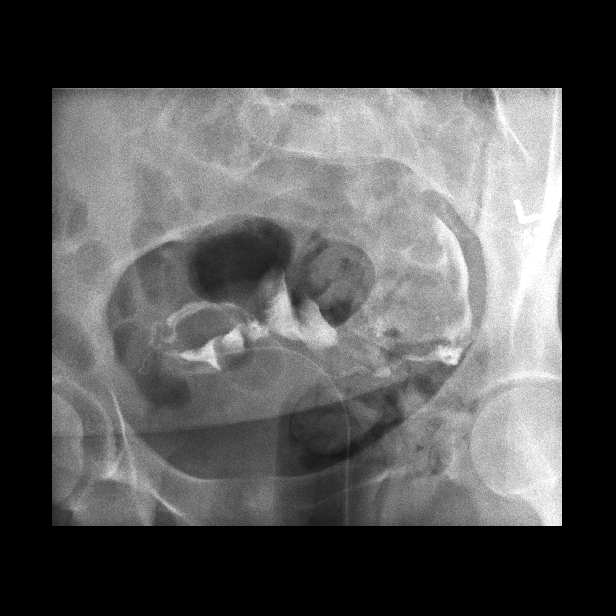
[im 2/3]
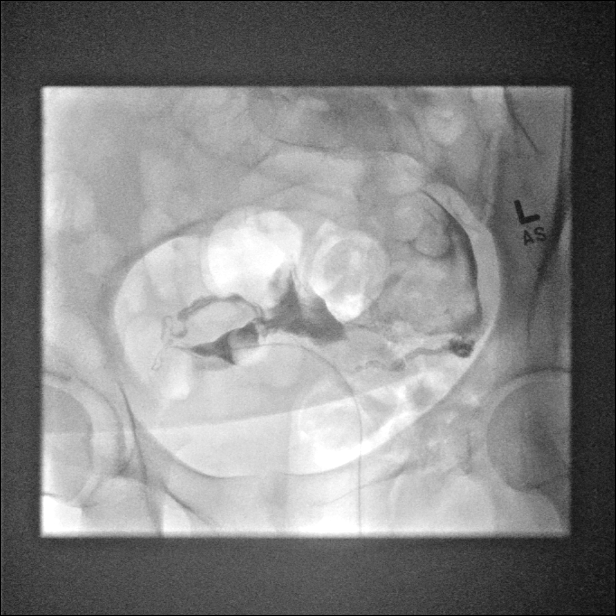
[im 3/3]
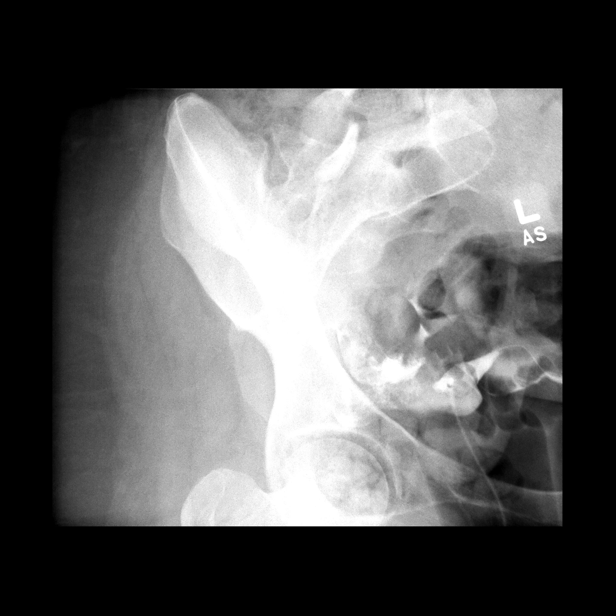

[Series 4: sequence · 2 of 3 frames shown (2 of 4)]
[frame 1/3]
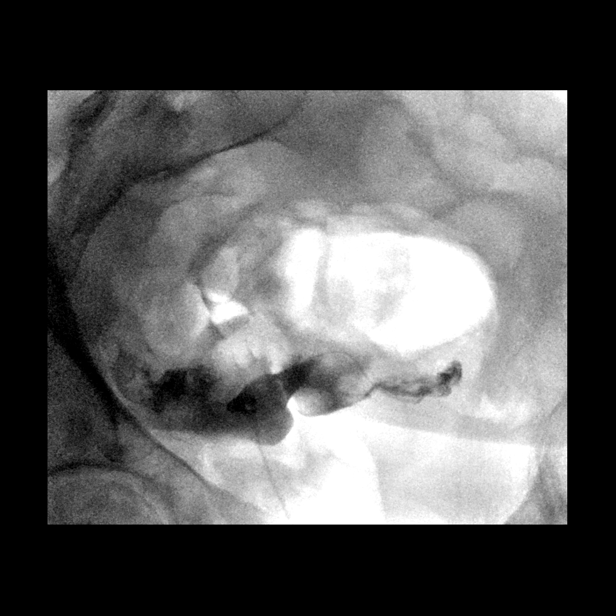
[frame 3/3]
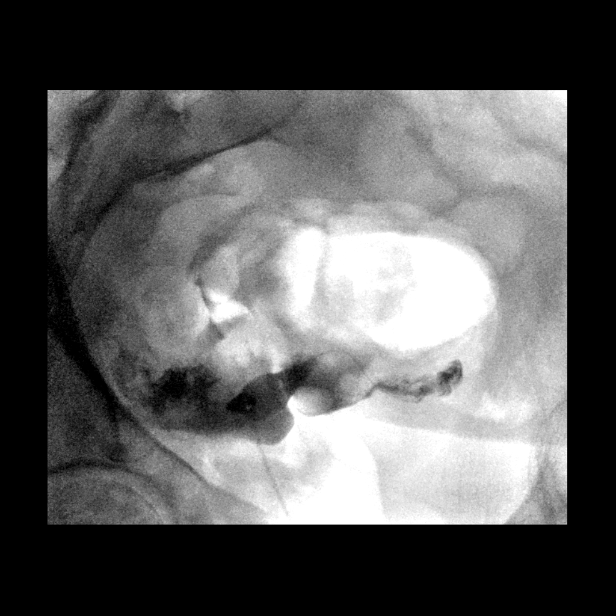

[Series 5: one shot · 0.15mm/px · 1 of 1 slices shown (3 of 3)]
[im 1/1]
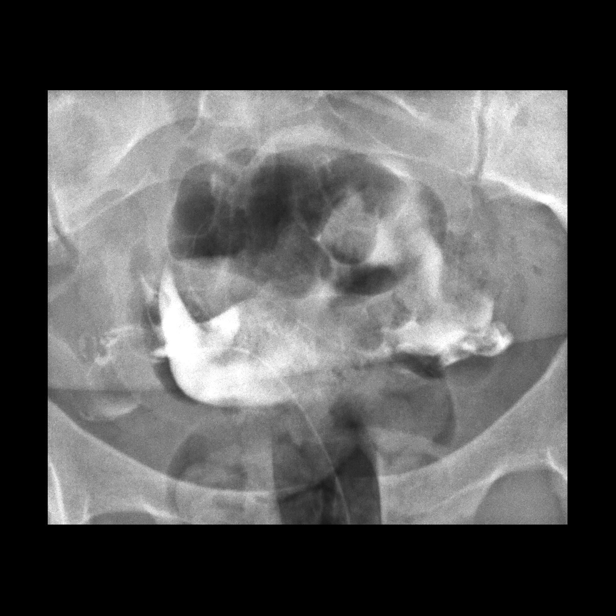

[Series 6: sequence · 0.23mm/px · 1 of 1 slices shown (3 of 4)]
[im 1/1]
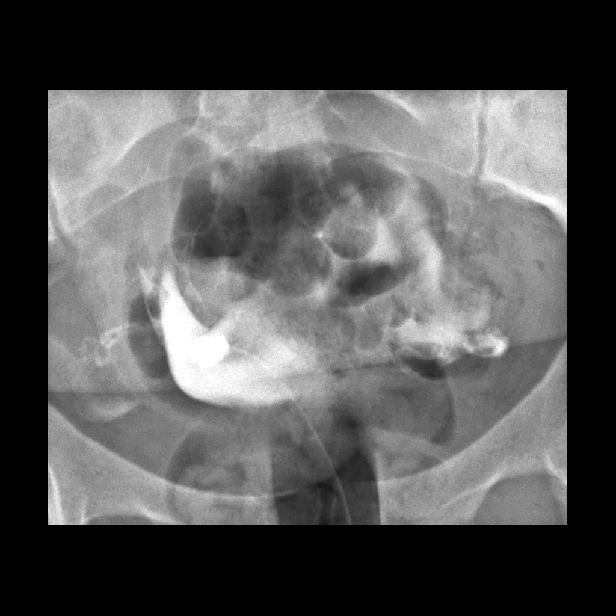

[Series 7: sequence · 0.23mm/px · 2 of 5 frames shown (4 of 4)]
[frame 3/5]
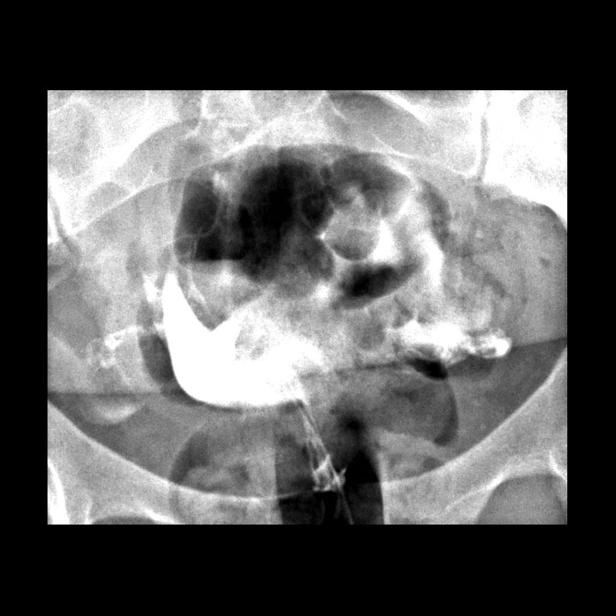
[frame 5/5]
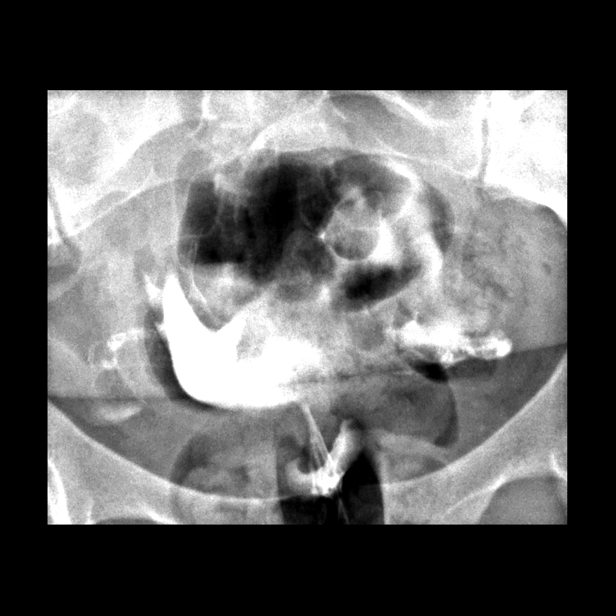

[14 of 18 positions shown; findings below may reference images not displayed]

FINDINGS: Endometrial Cavity: Normal appearance. No signs of Mullerian duct
anomaly or other significant abnormality.

Right Fallopian Tube: Normal appearance. Free intraperitoneal spill
of contrast is demonstrated.

Left Fallopian Tube: Normal appearance. Free intraperitoneal spill
of contrast is demonstrated.

Other: None.
IMPRESSION: Normal appearance of endometrial cavity.

Both Fallopian tubes are patent.

## 2023-12-29 DIAGNOSIS — E8889 Other specified metabolic disorders: Secondary | ICD-10-CM | POA: Diagnosis not present

## 2023-12-29 DIAGNOSIS — E162 Hypoglycemia, unspecified: Secondary | ICD-10-CM | POA: Diagnosis not present

## 2023-12-29 DIAGNOSIS — Z1331 Encounter for screening for depression: Secondary | ICD-10-CM | POA: Diagnosis not present

## 2023-12-29 DIAGNOSIS — I10 Essential (primary) hypertension: Secondary | ICD-10-CM | POA: Diagnosis not present

## 2024-02-02 ENCOUNTER — Encounter: Payer: Self-pay | Admitting: Obstetrics & Gynecology
